# Patient Record
Sex: Female | Born: 1935 | Race: White | Hispanic: No | Smoking: Never smoker
Health system: Southern US, Community
[De-identification: ages and names within clinical notes are randomized; demographics above are authoritative.]

## PROBLEM LIST (undated history)

## (undated) DIAGNOSIS — R609 Edema, unspecified: Secondary | ICD-10-CM

## (undated) DIAGNOSIS — H409 Unspecified glaucoma: Secondary | ICD-10-CM

## (undated) DIAGNOSIS — K219 Gastro-esophageal reflux disease without esophagitis: Secondary | ICD-10-CM

## (undated) DIAGNOSIS — R000 Tachycardia, unspecified: Secondary | ICD-10-CM

## (undated) DIAGNOSIS — H3530 Unspecified macular degeneration: Secondary | ICD-10-CM

## (undated) DIAGNOSIS — M1990 Unspecified osteoarthritis, unspecified site: Secondary | ICD-10-CM

## (undated) HISTORY — DX: Edema, unspecified: R60.9

## (undated) HISTORY — PX: TONSILLECTOMY: SUR1361

## (undated) HISTORY — DX: Unspecified macular degeneration: H35.30

## (undated) HISTORY — DX: Tachycardia, unspecified: R00.0

## (undated) HISTORY — DX: Unspecified glaucoma: H40.9

## (undated) HISTORY — DX: Unspecified osteoarthritis, unspecified site: M19.90

## (undated) HISTORY — PX: CATARACT EXTRACTION, BILATERAL: SHX1313

## (undated) HISTORY — DX: Gastro-esophageal reflux disease without esophagitis: K21.9

---

## 2018-02-22 ENCOUNTER — Other Ambulatory Visit: Payer: Self-pay

## 2018-02-22 ENCOUNTER — Inpatient Hospital Stay (HOSPITAL_COMMUNITY)
Admission: EM | Admit: 2018-02-22 | Discharge: 2018-02-27 | DRG: 062 | Disposition: A | Discharge status: Skilled Nursing Facility | Payer: Medicare Other | Attending: Neurology | Admitting: Neurology

## 2018-02-22 ENCOUNTER — Encounter (HOSPITAL_COMMUNITY): Payer: Self-pay

## 2018-02-22 ENCOUNTER — Emergency Department (HOSPITAL_COMMUNITY): Payer: Medicare Other

## 2018-02-22 DIAGNOSIS — G8194 Hemiplegia, unspecified affecting left nondominant side: Secondary | ICD-10-CM | POA: Diagnosis present

## 2018-02-22 DIAGNOSIS — Z7982 Long term (current) use of aspirin: Secondary | ICD-10-CM

## 2018-02-22 DIAGNOSIS — Z888 Allergy status to other drugs, medicaments and biological substances status: Secondary | ICD-10-CM

## 2018-02-22 DIAGNOSIS — I081 Rheumatic disorders of both mitral and tricuspid valves: Secondary | ICD-10-CM | POA: Diagnosis present

## 2018-02-22 DIAGNOSIS — E785 Hyperlipidemia, unspecified: Secondary | ICD-10-CM | POA: Diagnosis present

## 2018-02-22 DIAGNOSIS — R1310 Dysphagia, unspecified: Secondary | ICD-10-CM | POA: Diagnosis present

## 2018-02-22 DIAGNOSIS — I482 Chronic atrial fibrillation: Secondary | ICD-10-CM | POA: Diagnosis present

## 2018-02-22 DIAGNOSIS — H9190 Unspecified hearing loss, unspecified ear: Secondary | ICD-10-CM | POA: Diagnosis present

## 2018-02-22 DIAGNOSIS — N179 Acute kidney failure, unspecified: Secondary | ICD-10-CM | POA: Diagnosis present

## 2018-02-22 DIAGNOSIS — I639 Cerebral infarction, unspecified: Secondary | ICD-10-CM

## 2018-02-22 DIAGNOSIS — I509 Heart failure, unspecified: Secondary | ICD-10-CM | POA: Diagnosis present

## 2018-02-22 DIAGNOSIS — R29810 Facial weakness: Secondary | ICD-10-CM | POA: Diagnosis present

## 2018-02-22 DIAGNOSIS — I63411 Cerebral infarction due to embolism of right middle cerebral artery: Secondary | ICD-10-CM | POA: Diagnosis present

## 2018-02-22 DIAGNOSIS — I110 Hypertensive heart disease with heart failure: Secondary | ICD-10-CM | POA: Diagnosis present

## 2018-02-22 DIAGNOSIS — I6340 Cerebral infarction due to embolism of unspecified cerebral artery: Secondary | ICD-10-CM | POA: Diagnosis present

## 2018-02-22 DIAGNOSIS — J449 Chronic obstructive pulmonary disease, unspecified: Secondary | ICD-10-CM | POA: Diagnosis present

## 2018-02-22 DIAGNOSIS — I4891 Unspecified atrial fibrillation: Secondary | ICD-10-CM | POA: Diagnosis present

## 2018-02-22 DIAGNOSIS — R29713 NIHSS score 13: Secondary | ICD-10-CM | POA: Diagnosis present

## 2018-02-22 DIAGNOSIS — R414 Neurologic neglect syndrome: Secondary | ICD-10-CM | POA: Diagnosis present

## 2018-02-22 DIAGNOSIS — R471 Dysarthria and anarthria: Secondary | ICD-10-CM | POA: Diagnosis present

## 2018-02-22 DIAGNOSIS — D509 Iron deficiency anemia, unspecified: Secondary | ICD-10-CM | POA: Diagnosis present

## 2018-02-22 DIAGNOSIS — G629 Polyneuropathy, unspecified: Secondary | ICD-10-CM | POA: Diagnosis present

## 2018-02-22 DIAGNOSIS — Z79899 Other long term (current) drug therapy: Secondary | ICD-10-CM

## 2018-02-22 DIAGNOSIS — I272 Pulmonary hypertension, unspecified: Secondary | ICD-10-CM | POA: Diagnosis present

## 2018-02-22 DIAGNOSIS — Z885 Allergy status to narcotic agent status: Secondary | ICD-10-CM | POA: Diagnosis not present

## 2018-02-22 DIAGNOSIS — E876 Hypokalemia: Secondary | ICD-10-CM

## 2018-02-22 DIAGNOSIS — Z66 Do not resuscitate: Secondary | ICD-10-CM | POA: Diagnosis present

## 2018-02-22 DIAGNOSIS — Z9181 History of falling: Secondary | ICD-10-CM

## 2018-02-22 DIAGNOSIS — R4781 Slurred speech: Secondary | ICD-10-CM | POA: Diagnosis present

## 2018-02-22 DIAGNOSIS — I480 Paroxysmal atrial fibrillation: Secondary | ICD-10-CM

## 2018-02-22 DIAGNOSIS — D500 Iron deficiency anemia secondary to blood loss (chronic): Secondary | ICD-10-CM | POA: Diagnosis not present

## 2018-02-22 DIAGNOSIS — I340 Nonrheumatic mitral (valve) insufficiency: Secondary | ICD-10-CM | POA: Diagnosis not present

## 2018-02-22 DIAGNOSIS — D508 Other iron deficiency anemias: Secondary | ICD-10-CM | POA: Diagnosis not present

## 2018-02-22 LAB — I-STAT CHEM 8, ED
BUN: 34 mg/dL — ABNORMAL HIGH (ref 6–20)
CALCIUM ION: 1.16 mmol/L (ref 1.15–1.40)
CREATININE: 1.4 mg/dL — AB (ref 0.44–1.00)
Chloride: 98 mmol/L — ABNORMAL LOW (ref 101–111)
GLUCOSE: 109 mg/dL — AB (ref 65–99)
HCT: 24 % — ABNORMAL LOW (ref 36.0–46.0)
HEMOGLOBIN: 8.2 g/dL — AB (ref 12.0–15.0)
Potassium: 4.8 mmol/L (ref 3.5–5.1)
Sodium: 132 mmol/L — ABNORMAL LOW (ref 135–145)
TCO2: 25 mmol/L (ref 22–32)

## 2018-02-22 LAB — CBC WITH DIFFERENTIAL/PLATELET
Basophils Absolute: 0 10*3/uL (ref 0.0–0.1)
Basophils Relative: 0 %
EOS ABS: 0 10*3/uL (ref 0.0–0.7)
EOS PCT: 0 %
HCT: 22.3 % — ABNORMAL LOW (ref 36.0–46.0)
Hemoglobin: 7 g/dL — ABNORMAL LOW (ref 12.0–15.0)
Lymphocytes Relative: 36 %
Lymphs Abs: 3.6 10*3/uL (ref 0.7–4.0)
MCH: 25.4 pg — AB (ref 26.0–34.0)
MCHC: 31.4 g/dL (ref 30.0–36.0)
MCV: 80.8 fL (ref 78.0–100.0)
MONO ABS: 0.4 10*3/uL (ref 0.1–1.0)
Monocytes Relative: 4 %
Neutro Abs: 5.9 10*3/uL (ref 1.7–7.7)
Neutrophils Relative %: 60 %
PLATELETS: 288 10*3/uL (ref 150–400)
RBC: 2.76 MIL/uL — AB (ref 3.87–5.11)
RDW: 16.5 % — AB (ref 11.5–15.5)
WBC: 10 10*3/uL (ref 4.0–10.5)

## 2018-02-22 LAB — COMPREHENSIVE METABOLIC PANEL
ALK PHOS: 76 U/L (ref 38–126)
ALT: 8 U/L — AB (ref 14–54)
AST: 19 U/L (ref 15–41)
Albumin: 3 g/dL — ABNORMAL LOW (ref 3.5–5.0)
Anion gap: 10 (ref 5–15)
BILIRUBIN TOTAL: 0.7 mg/dL (ref 0.3–1.2)
BUN: 36 mg/dL — AB (ref 6–20)
CALCIUM: 8.8 mg/dL — AB (ref 8.9–10.3)
CO2: 24 mmol/L (ref 22–32)
CREATININE: 1.38 mg/dL — AB (ref 0.44–1.00)
Chloride: 99 mmol/L — ABNORMAL LOW (ref 101–111)
GFR, EST AFRICAN AMERICAN: 35 mL/min — AB (ref >60)
GFR, EST NON AFRICAN AMERICAN: 30 mL/min — AB (ref >60)
Glucose, Bld: 110 mg/dL — ABNORMAL HIGH (ref 65–99)
Potassium: 4.9 mmol/L (ref 3.5–5.1)
Sodium: 133 mmol/L — ABNORMAL LOW (ref 135–145)
Total Protein: 5.7 g/dL — ABNORMAL LOW (ref 6.5–8.1)

## 2018-02-22 LAB — I-STAT TROPONIN, ED: TROPONIN I, POC: 0.03 ng/mL (ref 0.00–0.08)

## 2018-02-22 LAB — PROTIME-INR
INR: 1.13
Prothrombin Time: 14.4 seconds (ref 11.4–15.2)

## 2018-02-22 LAB — APTT: aPTT: 31 seconds (ref 24–36)

## 2018-02-22 MED ORDER — ONDANSETRON HCL 4 MG/2ML IJ SOLN
4.0000 mg | Freq: Once | INTRAMUSCULAR | Status: AC
  Administered 2018-02-22: 4 mg via INTRAVENOUS

## 2018-02-22 MED ORDER — STROKE: EARLY STAGES OF RECOVERY BOOK
Freq: Once | Status: DC
  Filled 2018-02-22: qty 1

## 2018-02-22 MED ORDER — METOPROLOL SUCCINATE ER 50 MG PO TB24
50.0000 mg | ORAL_TABLET | Freq: Every day | ORAL | Status: DC
  Administered 2018-02-23: 50 mg via ORAL
  Filled 2018-02-22: qty 1

## 2018-02-22 MED ORDER — SODIUM CHLORIDE 0.9 % IV SOLN
INTRAVENOUS | Status: DC
  Administered 2018-02-22: 19:00:00 via INTRAVENOUS

## 2018-02-22 MED ORDER — PANTOPRAZOLE SODIUM 40 MG IV SOLR
40.0000 mg | Freq: Every day | INTRAVENOUS | Status: DC
  Administered 2018-02-22: 40 mg via INTRAVENOUS
  Filled 2018-02-22: qty 40

## 2018-02-22 MED ORDER — ALBUTEROL SULFATE (2.5 MG/3ML) 0.083% IN NEBU
3.0000 mL | INHALATION_SOLUTION | RESPIRATORY_TRACT | Status: DC
  Administered 2018-02-23: 3 mL via RESPIRATORY_TRACT
  Filled 2018-02-22: qty 3

## 2018-02-22 MED ORDER — LABETALOL HCL 5 MG/ML IV SOLN: 10.0000 mg | INTRAVENOUS | Status: DC | PRN

## 2018-02-22 MED ORDER — FUROSEMIDE 20 MG PO TABS
20.0000 mg | ORAL_TABLET | Freq: Two times a day (BID) | ORAL | Status: DC
  Administered 2018-02-23: 20 mg via ORAL
  Filled 2018-02-22: qty 1

## 2018-02-22 MED ORDER — ALTEPLASE (STROKE) FULL DOSE INFUSION
0.9000 mg/kg | Freq: Once | INTRAVENOUS | Status: AC
  Administered 2018-02-22: 46.9 mg via INTRAVENOUS
  Filled 2018-02-22: qty 100

## 2018-02-22 NOTE — Consult Note (Addendum)
Chief Complaint:  Left side weakness  History obtained from: Patient and Chart  HPI:                                                                                                                                         Krista Cook is an 84 y.o. female a past medical history of atrial fibrillation not on anticoagulation, COPD,CHF peripheral neuropathy presents as a stroke alert.  Patient lives in assisted living facility and per caretaker last seen normal was 4:30 PM. She was then noted to have sudden onset dysarthria and left-sided weakness. EMS was called and [facility]. Blood pressure was 130 systolic on arrival. She was noted to be neglecting the left side, in addition to having left facial droop, dysarthria and some weakness. CT head showed no acute findings and aspects 10. NIHSS was 13. She received iv TPA after speaking with daughter.  After having prolonged discussion with daughter, it was decided that we would not pursue mechanical thrombectomy even if she had a large vessel occlusion has a daughter who was POA felt this would too invasive.     Date last known well: Date: 02/22/2018 Time last known well: Time: 16:30 tPA Given: Yes Modified Rankin: Rankin Score=1     Allergies:  Allergies  Allergen Reactions  . Celebrex [Celecoxib]   . Ultram [Tramadol]     Medications:                                                                                                                           Reviewed from care everywhere  ROS:  History obtained from patient   General ROS: negative for - chills, fatigue, fever, night sweats, weight gain or weight loss Psychological ROS: negative for - , hallucinations, memory difficulties, mood swings or  Ophthalmic ROS: negative for - blurry vision, double vision, eye  pain or loss of vision ENT ROS: negative for - epistaxis, nasal discharge, oral lesions, sore throat, tinnitus or vertigo Respiratory ROS: negative for - cough,  shortness of breath or wheezing Cardiovascular ROS: negative for - chest pain, dyspnea on exertion,  Gastrointestinal ROS: negative for - abdominal pain, diarrhea,  nausea/vomiting or stool incontinence Genito-Urinary ROS: negative for - dysuria, hematuria, incontinence or urinary frequency/urgency Musculoskeletal ROS: negative for - joint swelling or muscular weakness Neurological ROS: as noted in HPI   General Examination:                                                                                                      Blood pressure 118/64, pulse 76, resp. rate 20, height 5\' 2"  (1.575 m), weight 51.7 kg (114 lb), SpO2 100 %.  HEENT-  Normocephalic, no lesions, without obvious abnormality.  Normal external eye and conjunctiva.   Cardiovascular- S1-S2 audible, pulses palpable throughout   Lungs-no rhonchi or wheezing noted, no excessive working breathing.  Saturations within normal limits Abdomen- All 4 quadrants palpated and nontender Extremities- Warm, dry and intact Musculoskeletal-no joint tenderness, deformity or swelling Skin-warm and dry, no hyperpigmentation, vitiligo, or suspicious lesions  Neurological Examination Mental Status: Alert, oriented, thought content appropriate.  Speech fluent without evidence of aphasia.  Moderately dysarthric. Able to follow 3 step commands without difficulty. Cranial Nerves: II: Discs flat bilaterally; Visual fields: left hemineglect III,IV, VI: right gaze preference, crosses midline V,VII: left facial droop, crease sensation of left-sided face VIII: hearing normal bilaterally IX,X: uvula rises symmetrically XI: bilateral shoulder shrug XII: midline tongue extension Motor: Right : Upper extremity   5/5    Left:     Upper extremity   3/5  Lower extremity   5/5     Lower extremity    3/5 Tone and bulk:normal tone throughout; no atrophy noted Sensory: reduced sensation of left arm and leg Deep Tendon Reflexes: 1+ and symmetric throughout Plantars: Right: downgoing   Left: downgoing Cerebellar: normal finger-to-nose on right side Gait: not assess gait due to safety   Lab Results: Basic Metabolic Panel: Recent Labs  Lab 02/22/18 1739 02/22/18 1746  NA 133* 132*  K 4.9 4.8  CL 99* 98*  CO2 24  --   GLUCOSE 110* 109*  BUN 36* 34*  CREATININE 1.38* 1.40*  CALCIUM 8.8*  --     CBC: Recent Labs  Lab 02/22/18 1746 02/22/18 2034  WBC  --  10.0  NEUTROABS  --  5.9  HGB 8.2* 7.0*  HCT 24.0* 22.3*  MCV  --  80.8  PLT  --  288    Lipid Panel: No results for input(s): CHOL, TRIG, HDL, CHOLHDL, VLDL, LDLCALC in the last 168 hours.  CBG: No results for input(s): GLUCAP in the last 168 hours.  Imaging: Ct  Head Code Stroke Wo Contrast  Result Date: 02/22/2018 CLINICAL DATA:  Code stroke. Initial evaluation for acute slurred speech, left-sided deficits. EXAM: CT HEAD WITHOUT CONTRAST TECHNIQUE: Contiguous axial images were obtained from the base of the skull through the vertex without intravenous contrast. COMPARISON:  None. FINDINGS: Brain: Generalized age-related cerebral atrophy. Confluent hypodensity within the periventricular white matter most consistent with chronic small vessel ischemic disease, mild to moderate nature. No acute intracranial hemorrhage. No acute large vessel territory infarct. No mass lesion, midline shift or mass effect. Mild diffuse ventricular prominence related global parenchymal volume loss of hydrocephalus. No extra-axial fluid collection. Vascular: No asymmetric hyperdense vessel. Scattered vascular calcifications noted within the carotid siphons. Skull: Scalp soft tissues and calvarium demonstrate no acute abnormality. Sinuses/Orbits: Globes and orbital soft tissues within normal limits. Paranasal sinuses and mastoid air cells are  clear. Other: None. ASPECTS [facility] Stroke Program Early CT Score) - Ganglionic level infarction (caudate, lentiform nuclei, internal capsule, insula, M1-M3 cortex): 7 - Supraganglionic infarction (M4-M6 cortex): 3 Total score (0-10 with 10 being normal): 10 IMPRESSION: 1. No acute intracranial infarct or other process identified. 2. ASPECTS is 10. 3. Generalized age-related cerebral atrophy with mild to moderate chronic small vessel ischemic disease. These results were communicated to Dr. Laurence Slate at 6:04 pmon 3/25/2019by text page via the [facility] messaging system. Electronically Signed   By: Rise Mu M.D.   On: 02/22/2018 18:09   ASSESSMENT AND PLAN  84 y.o. female a past medical history of atrial fibrillation not on anticoagulation, COPD,CHF peripheral neuropathy presents as a stroke alert for left neglect, dysarthria, left facial droop and left sided weakness. Likely right MCA stroke due to embolus from chronic atrial fibrillation not being on anticoagulation. Previously on warfarin however this was discontinued due to concern of falls.Currently on aspirin 81 mg  She received IV TPA. Patient's symptoms are improving and after having discussion with daughter - given patient's wishes and history of COPD, moderately dependent lifestyle risk of intevention for possible LVO would outweigh risk of intervention.    Acute Ischemic Stroke   Risk factors: Afib, COPD, CHF Etiology: cardioembolic  Recommend # MRI of the brain without contrast #MRA Head and neck  #Transthoracic Echo  # no antiplatelet as patient received tPA #Start or continue Atorvastatin 80 mg/other high intensity statin # BP goal: permissive HTN upto 185/100 mmHg # HBAIC and Lipid profile # Telemetry monitoring # Frequent neuro checks # NPO until passes stroke swallow screen  Chronic Atrial Fibrillation Not on AC Continue rate control medication metoprolol   COPD Continue home duo nebs  CHF Gentle hydration to  avoid volume overload Hold ASA, continue BB  Peripheral neuropathy Continue gabapentin   DVT PPX; SCD Diet: NPO until bedside swallow eval  Please page stroke NP  Or  PA  Or MD from 8am -4 pm  as this patient from this time will be  followed by the stroke.   You can look them up on [email or web address]  Password [email or web address]    This patient is neurologically critically ill due to stroke and received IV tpA. She is at risk for significant risk of neurological worsening from cerebral edema,  death from brain herniation, heart failure, hemorrhagic conversion, infection, respiratory failure and seizure. This patient's care requires constant monitoring of vital signs, hemodynamics, respiratory and cardiac monitoring, review of multiple databases, neurological assessment, discussion with family, other specialists and medical decision making of high complexity.  I spent 60 minutes of neurocritical time in the care  of this patient.

## 2018-02-22 NOTE — ED Provider Notes (Signed)
[facility] EMERGENCY DEPARTMENT Provider Note   CSN: [id number] Arrival date & time: 02/22/18  1734   An emergency department physician performed an initial assessment on this suspected stroke patient at 1730.  History   Chief Complaint Chief Complaint  Patient presents with  . Code Stroke    HPI Shanena Pellegrino is a 84 y.o. female.  HPI Patient lives independently at an assisted living.  Per the patient's daughter, at 4: 30 staff came to bring the patient her dinner menu and advised that her speech was slurred and she had left-sided weakness.  Patient's daughter reports they called her and she advised for transport to the emergency department.  I have seen the patient after initiation of TPA.  Patient's daughter reports that she was told she had significant left sided weakness.  At the current examination patient is able to elevate her left extremities off of the bed and also perform grip and flexion extension which per her daughter, is a significant improvement.  The patient has no complaints.  She denies any headache or chest pain.  She denies awareness that she was having the symptoms as reported.  The patient's daughter reports that yesterday she was well.  She has been active at baseline but has had a couple of more recent falls.  She reports that she did strain her lower back and that has been bothering her more recently. History reviewed. No pertinent past medical history.  Patient Active Problem List   Diagnosis Date Noted  . Stroke (cerebrum) (HCC) 02/22/2018    History reviewed. No pertinent surgical history.   OB History   None      Home Medications    Prior to Admission medications   Not on File    Family History History reviewed. No pertinent family history.  Social History Social History   Tobacco Use  . Smoking status: Never Smoker  . Smokeless tobacco: Never Used  Substance Use Topics  . Alcohol use: Not on file  . Drug use:  Not on file     Allergies   Celebrex [celecoxib] and Ultram [tramadol]   Review of Systems Review of Systems 10 Systems reviewed and are negative for acute change except as noted in the HPI.   Physical Exam Updated Vital Signs BP (!) 152/78   Pulse 92   Resp (!) 23   Ht 5\' 2"  (1.575 m)   Wt 51.7 kg (114 lb) Comment: Simultaneous filing. User may not have seen previous data.  SpO2 99% Comment: 2L o2  BMI 20.85 kg/m   Physical Exam  Constitutional:  Patient is alert.  She has no respiratory distress.  She does seem to have a evident left mouth droop.  HENT:  Head: Normocephalic and atraumatic.  Nose: Nose normal.  Mouth/Throat: Oropharynx is clear and moist.  Eyes: Pupils are equal, round, and reactive to light. EOM are normal.  Neck: Neck supple.  Cardiovascular:  Irregularly irregular.  No gross rub murmur gallop.  Pulmonary/Chest: Effort normal and breath sounds normal.  Abdominal: Soft. She exhibits no distension. There is no tenderness. There is no guarding.  Musculoskeletal:  Patient has a number of senile ecchymoses on her lower extremities.  Trace edema bilaterally.  No deformities of the extremities.  Neurological:  The patient is alert.  She follows commands appropriately.  He has pronounced left facial droop.  Speech is slightly slurred but content is normal.  Due to facial droop, patient's respirations make a soft sonorous sound.  Airway is protected however no pooling secretions or difficulty breathing.  Patient has grip strength bilateral upper extremities.  Left is at 3\5 relative to right.  Patient can elevate each lower extremity off of the bed and hold.  She has intact Lanter flexion strength.  Skin: Skin is warm and dry.  Psychiatric: She has a normal mood and affect.     ED Treatments / Results  Labs (all labs ordered are listed, but only abnormal results are displayed) Labs Reviewed  I-STAT CHEM 8, ED - Abnormal; Notable for the following  components:      Result Value   Sodium 132 (*)    Chloride 98 (*)    BUN 34 (*)    Creatinine, Ser 1.40 (*)    Glucose, Bld 109 (*)    Hemoglobin 8.2 (*)    HCT 24.0 (*)    All other components within normal limits  PROTIME-INR  APTT  COMPREHENSIVE METABOLIC PANEL  CBC WITH DIFFERENTIAL/PLATELET  HEMOGLOBIN A1C  LIPID PANEL  I-STAT TROPONIN, ED  CBG MONITORING, ED    EKG None  Radiology Ct Head Code Stroke Wo Contrast  Result Date: 02/22/2018 CLINICAL DATA:  Code stroke. Initial evaluation for acute slurred speech, left-sided deficits. EXAM: CT HEAD WITHOUT CONTRAST TECHNIQUE: Contiguous axial images were obtained from the base of the skull through the vertex without intravenous contrast. COMPARISON:  None. FINDINGS: Brain: Generalized age-related cerebral atrophy. Confluent hypodensity within the periventricular white matter most consistent with chronic small vessel ischemic disease, mild to moderate nature. No acute intracranial hemorrhage. No acute large vessel territory infarct. No mass lesion, midline shift or mass effect. Mild diffuse ventricular prominence related global parenchymal volume loss of hydrocephalus. No extra-axial fluid collection. Vascular: No asymmetric hyperdense vessel. Scattered vascular calcifications noted within the carotid siphons. Skull: Scalp soft tissues and calvarium demonstrate no acute abnormality. Sinuses/Orbits: Globes and orbital soft tissues within normal limits. Paranasal sinuses and mastoid air cells are clear. Other: None. ASPECTS [facility] Stroke Program Early CT Score) - Ganglionic level infarction (caudate, lentiform nuclei, internal capsule, insula, M1-M3 cortex): 7 - Supraganglionic infarction (M4-M6 cortex): 3 Total score (0-10 with 10 being normal): 10 IMPRESSION: 1. No acute intracranial infarct or other process identified. 2. ASPECTS is 10. 3. Generalized age-related cerebral atrophy with mild to moderate chronic small vessel ischemic  disease. These results were communicated to Dr. Laurence Slate at 6:04 pmon 3/25/2019by text page via the [facility] messaging system. Electronically Signed   By: Rise Mu M.D.   On: 02/22/2018 18:09    Procedures Procedures (including critical care time)  Medications Ordered in ED Medications  alteplase (ACTIVASE) 1 mg/mL infusion 46.9 mg (46.9 mg Intravenous New Bag/Given 02/22/18 1758)   stroke: mapping our early stages of recovery book (has no administration in time range)  0.9 %  sodium chloride infusion (has no administration in time range)  pantoprazole (PROTONIX) injection 40 mg (has no administration in time range)  labetalol (NORMODYNE,TRANDATE) injection 10 mg (has no administration in time range)     Initial Impression / Assessment and Plan / ED Course  I have reviewed the triage vital signs and the nursing notes.  Pertinent labs & imaging results that were available during my care of the patient were reviewed by me and considered in my medical decision making (see chart for details).    Patient has already been seen by neurology as a code stroke with initiation of management with TPA.  Final Clinical Impressions(s) / ED Diagnoses  Final diagnoses:  Acute ischemic stroke [facility](HCC)  Paroxysmal atrial fibrillation [facility](HCC)  Patient arrived as a code stroke.  Management with TPA initiated by neurology upon initial arrival CT assessment.  It appears the patient has shown some improvement by the description given from her daughter.  At this time, the patient is awake and alert.  She does have persistent facial droop and slightly slurred speech but can independently use all 4 extremities with slight weakness of the left upper extremity relative to the right.  We will continue to observe the patient while she is in the emergency department with planned admission continued management by neurology.  ED Discharge Orders    None       Arby BarrettePfeiffer, Kairos Panetta, MD 02/22/18 [phone]

## 2018-02-22 NOTE — ED Triage Notes (Signed)
Pt arrives to ED from independent living facility with complaints of slurred speech and left sided deficits  since 1630 this evening when pt was provided with a dinner menu by staff. Code stroke called, pt brought to Ct scanner, TPA initiated, and then ED room C22, family present (does not want pt to go to IR for any procedures). Pt placed in position of comfort with bed locked and lowered, call bell in reach.

## 2018-02-22 NOTE — ED Notes (Signed)
Pt had one episode of small vomitus, zofran given

## 2018-02-22 NOTE — Progress Notes (Signed)
Krista Cook DoingFrances Cabell is a [age] yo female coming from independent living [facility] with acute onset of slurred speech and Left side deficits. LKW 1630 by staff. NIH 13, CBG 109. TPA started at [id number]

## 2018-02-23 ENCOUNTER — Inpatient Hospital Stay (HOSPITAL_COMMUNITY): Payer: Medicare Other

## 2018-02-23 DIAGNOSIS — D500 Iron deficiency anemia secondary to blood loss (chronic): Secondary | ICD-10-CM

## 2018-02-23 DIAGNOSIS — I639 Cerebral infarction, unspecified: Secondary | ICD-10-CM

## 2018-02-23 DIAGNOSIS — I482 Chronic atrial fibrillation: Secondary | ICD-10-CM

## 2018-02-23 DIAGNOSIS — E785 Hyperlipidemia, unspecified: Secondary | ICD-10-CM

## 2018-02-23 DIAGNOSIS — I63411 Cerebral infarction due to embolism of right middle cerebral artery: Secondary | ICD-10-CM

## 2018-02-23 LAB — CBC
HCT: 23.6 % — ABNORMAL LOW (ref 36.0–46.0)
Hemoglobin: 7.5 g/dL — ABNORMAL LOW (ref 12.0–15.0)
MCH: 26.5 pg (ref 26.0–34.0)
MCHC: 31.8 g/dL (ref 30.0–36.0)
MCV: 83.4 fL (ref 78.0–100.0)
PLATELETS: 291 10*3/uL (ref 150–400)
RBC: 2.83 MIL/uL — ABNORMAL LOW (ref 3.87–5.11)
RDW: 16.8 % — ABNORMAL HIGH (ref 11.5–15.5)
WBC: 9.3 10*3/uL (ref 4.0–10.5)

## 2018-02-23 LAB — LACTATE DEHYDROGENASE: LDH: 152 U/L (ref 98–192)

## 2018-02-23 LAB — IRON AND TIBC
IRON: 12 ug/dL — AB (ref 28–170)
SATURATION RATIOS: 3 % — AB (ref 10.4–31.8)
TIBC: 421 ug/dL (ref 250–450)
UIBC: 409 ug/dL

## 2018-02-23 LAB — VITAMIN B12: VITAMIN B 12: 541 pg/mL (ref 180–914)

## 2018-02-23 LAB — LIPID PANEL
CHOL/HDL RATIO: 2.6 ratio
CHOLESTEROL: 138 mg/dL (ref 0–200)
HDL: 53 mg/dL (ref >40)
LDL Cholesterol: 78 mg/dL (ref 0–99)
Triglycerides: 33 mg/dL (ref <150)
VLDL: 7 mg/dL (ref 0–40)

## 2018-02-23 LAB — BASIC METABOLIC PANEL
Anion gap: 8 (ref 5–15)
BUN: 29 mg/dL — ABNORMAL HIGH (ref 6–20)
CALCIUM: 8.6 mg/dL — AB (ref 8.9–10.3)
CO2: 23 mmol/L (ref 22–32)
Chloride: 102 mmol/L (ref 101–111)
Creatinine, Ser: 1.15 mg/dL — ABNORMAL HIGH (ref 0.44–1.00)
GFR calc Af Amer: 44 mL/min — ABNORMAL LOW (ref >60)
GFR, EST NON AFRICAN AMERICAN: 38 mL/min — AB (ref >60)
GLUCOSE: 136 mg/dL — AB (ref 65–99)
POTASSIUM: 4.5 mmol/L (ref 3.5–5.1)
Sodium: 133 mmol/L — ABNORMAL LOW (ref 135–145)

## 2018-02-23 LAB — FERRITIN: Ferritin: 10 ng/mL — ABNORMAL LOW (ref 11–307)

## 2018-02-23 LAB — HEMOGLOBIN A1C
Hgb A1c MFr Bld: 5.2 % (ref 4.8–5.6)
Mean Plasma Glucose: 102.54 mg/dL

## 2018-02-23 LAB — MRSA PCR SCREENING: MRSA by PCR: NEGATIVE

## 2018-02-23 LAB — FOLATE: Folate: 14.5 ng/mL (ref >5.9)

## 2018-02-23 MED ORDER — FERROUS SULFATE 325 (65 FE) MG PO TABS
325.0000 mg | ORAL_TABLET | Freq: Three times a day (TID) | ORAL | Status: DC
  Administered 2018-02-23 – 2018-02-27 (×11): 325 mg via ORAL
  Filled 2018-02-23 (×11): qty 1

## 2018-02-23 MED ORDER — PANTOPRAZOLE SODIUM 40 MG PO TBEC
40.0000 mg | DELAYED_RELEASE_TABLET | Freq: Every day | ORAL | Status: DC
  Administered 2018-02-23 – 2018-02-25 (×3): 40 mg via ORAL
  Filled 2018-02-23 (×3): qty 1

## 2018-02-23 MED ORDER — ATORVASTATIN CALCIUM 10 MG PO TABS
10.0000 mg | ORAL_TABLET | Freq: Every day | ORAL | Status: DC
  Administered 2018-02-23 – 2018-02-26 (×4): 10 mg via ORAL
  Filled 2018-02-23 (×4): qty 1

## 2018-02-23 MED ORDER — ALBUTEROL SULFATE (2.5 MG/3ML) 0.083% IN NEBU: 3.0000 mL | INHALATION_SOLUTION | RESPIRATORY_TRACT | Status: DC | PRN

## 2018-02-23 MED ORDER — MOMETASONE FURO-FORMOTEROL FUM 100-5 MCG/ACT IN AERO
1.0000 | INHALATION_SPRAY | Freq: Two times a day (BID) | RESPIRATORY_TRACT | Status: DC
  Administered 2018-02-23 – 2018-02-27 (×5): 1 via RESPIRATORY_TRACT
  Filled 2018-02-23 (×2): qty 8.8

## 2018-02-23 MED ORDER — METOPROLOL SUCCINATE ER 25 MG PO TB24
25.0000 mg | ORAL_TABLET | Freq: Every day | ORAL | Status: DC
  Administered 2018-02-24 – 2018-02-27 (×3): 25 mg via ORAL
  Filled 2018-02-23 (×3): qty 1

## 2018-02-23 MED ORDER — ASPIRIN EC 81 MG PO TBEC
81.0000 mg | DELAYED_RELEASE_TABLET | Freq: Every day | ORAL | Status: DC
  Administered 2018-02-23 – 2018-02-24 (×2): 81 mg via ORAL
  Filled 2018-02-23 (×2): qty 1

## 2018-02-23 NOTE — Progress Notes (Signed)
OT Cancellation Note  Patient Details Name: Krista Cook MRN: [id number] DOB: 02/02/1915   Cancelled Treatment:    Reason Eval/Treat Not Completed: Active bedrest order  [facility] Center[facility],HILLARY  Agripina Guyette, OT/L  [phone] 02/23/2018 02/23/2018, 6:59 AM

## 2018-02-23 NOTE — Progress Notes (Signed)
*  PRELIMINARY RESULTS* Vascular Ultrasound Carotid Duplex (Doppler) has been completed.  Findings suggest 1-39% internal carotid artery stenosis bilaterally. Vertebral arteries are patent with antegrade flow.  02/23/2018 2:36 PM Gertie FeyMichelle Anette Barra, BS, RVT, RDCS, RDMS

## 2018-02-23 NOTE — Evaluation (Signed)
Clinical/Bedside Swallow Evaluation Patient Details  Name: Krista Cook Standing MRN: [id number] Date of Birth: 11/11/1915  Today's Date: 02/23/2018 Time: SLP Start Time (ACUTE ONLY): 0907 SLP Stop Time (ACUTE ONLY): 0920 SLP Time Calculation (min) (ACUTE ONLY): 13 min  Past Medical History: History reviewed. No pertinent past medical history. Past Surgical History: History reviewed. No pertinent surgical history. HPI:  Krista MadeiraFrances Stamatasis an [age] y.o.femalea past medical history of atrial fibrillation not on anticoagulation, COPD,CHF peripheral neuropathy presents as a stroke alert.Patient lives in assisted living facility and per caretaker last seen normal was 4:30 PM. She was then noted to have sudden onset dysarthria and left-sided weakness. . CT head showed no acute findings and aspects 10. NIHSS was 13. She received iv TPA after speaking with daughter.    Assessment / Plan / Recommendation Clinical Impression  Pt demonstrates no signs of acute impairment of swallowing or oral neuromuscular weakness following CVA. There are signs of mild age related changes to swallow mechanism including suspected minimal delay in swallow intiation given slightly audible swallow. A few instances of soft throat clearing with liquids and puree also noted, though no coughing or wet vocal quality observed. Pt reports no history of dysphagia and no prior diet modifications. Recommend pt resume a regular diet and thin liquids though SLP will f/u x1 to monitor tolerance of diet and address cognitive linguistic testing.  SLP Visit Diagnosis: Dysphagia, unspecified (R13.10)    Aspiration Risk  Mild aspiration risk    Diet Recommendation Regular;Thin liquid   Liquid Administration via: Cup;Straw Medication Administration: Whole meds with puree Supervision: Patient able to self feed Compensations: Slow rate;Small sips/bites Postural Changes: Seated upright at 90 degrees    Other  Recommendations Oral Care  Recommendations: Oral care BID   Follow up Recommendations 24 hour supervision/assistance      Frequency and Duration min 1 x/week  1 week       Prognosis Prognosis for Safe Diet Advancement: Good      Swallow Study   General HPI: Krista MadeiraFrances Stamatasis an [age] y.o.femalea past medical history of atrial fibrillation not on anticoagulation, COPD,CHF peripheral neuropathy presents as a stroke alert.Patient lives in assisted living facility and per caretaker last seen normal was 4:30 PM. She was then noted to have sudden onset dysarthria and left-sided weakness. . CT head showed no acute findings and aspects 10. NIHSS was 13. She received iv TPA after speaking with daughter.  Type of Study: Bedside Swallow Evaluation Previous Swallow Assessment: none Diet Prior to this Study: NPO Temperature Spikes Noted: No Respiratory Status: Room air History of Recent Intubation: No Behavior/Cognition: Alert;Cooperative;Pleasant mood Oral Cavity Assessment: Within Functional Limits Oral Care Completed by SLP: No Oral Cavity - Dentition: Adequate natural dentition Vision: Functional for self-feeding Self-Feeding Abilities: Able to feed self Patient Positioning: Upright in bed Baseline Vocal Quality: Normal Volitional Cough: Strong Volitional Swallow: Able to elicit    Oral/Motor/Sensory Function Overall Oral Motor/Sensory Function: Within functional limits   Ice Chips     Thin Liquid Thin Liquid: Impaired Presentation: Cup;Straw;Self Fed Pharyngeal  Phase Impairments: Throat Clearing - Immediate    Nectar Thick Nectar Thick Liquid: Not tested   Honey Thick Honey Thick Liquid: Not tested   Puree Puree: Impaired Presentation: Self Fed;Spoon Pharyngeal Phase Impairments: Throat Clearing - Immediate   Solid   GO   Solid: Within functional limits       Harlon DittyBonnie Mohit Zirbes, MA CCC-SLP [phone]  Claudine MoutonDeBlois, Mykle [place] 02/23/2018,9:24 AM

## 2018-02-23 NOTE — Progress Notes (Addendum)
STROKE TEAM PROGRESS NOTE   SUBJECTIVE (INTERVAL HISTORY) No family is at the bedside.  Patient lying in bed. No complaints. Feels her speech is improved to baseline. HGB low, no obvious bleeding. Daughter lives in [place].   OBJECTIVE Vitals:   02/23/18 0500 02/23/18 0600 02/23/18 0700 02/23/18 0800  BP: (!) 116/57 117/62 120/61 120/74  Pulse: 73 75 79 80  Resp: 14 18 17 15   Temp:      TempSrc:      SpO2: 94% 96% 96% 97%  Weight:      Height:        CBC:  Recent Labs  Lab 02/22/18 1746 02/22/18 2034  WBC  --  10.0  NEUTROABS  --  5.9  HGB 8.2* 7.0*  HCT 24.0* 22.3*  MCV  --  80.8  PLT  --  288    Basic Metabolic Panel:  Recent Labs  Lab 02/22/18 1739 02/22/18 1746  NA 133* 132*  K 4.9 4.8  CL 99* 98*  CO2 24  --   GLUCOSE 110* 109*  BUN 36* 34*  CREATININE 1.38* 1.40*  CALCIUM 8.8*  --     Lipid Panel:     Component Value Date/Time   CHOL 138 02/23/2018 0334   TRIG 33 02/23/2018 0334   HDL 53 02/23/2018 0334   CHOLHDL 2.6 02/23/2018 0334   VLDL 7 02/23/2018 0334   LDLCALC 78 02/23/2018 0334   HgbA1c:  Lab Results  Component Value Date   HGBA1C 5.2 02/23/2018   Urine Drug Screen: No results found for: LABOPIA, COCAINSCRNUR, LABBENZ, AMPHETMU, THCU, LABBARB  Alcohol Level No results found for: ETH  IMAGING  Ct Head Code Stroke Wo Contrast  Result Date: 02/22/2018 CLINICAL DATA:  Code stroke. Initial evaluation for acute slurred speech, left-sided deficits. EXAM: CT HEAD WITHOUT CONTRAST TECHNIQUE: Contiguous axial images were obtained from the base of the skull through the vertex without intravenous contrast. COMPARISON:  None. FINDINGS: Brain: Generalized age-related cerebral atrophy. Confluent hypodensity within the periventricular white matter most consistent with chronic small vessel ischemic disease, mild to moderate nature. No acute intracranial hemorrhage. No acute large vessel territory infarct. No mass lesion, midline shift or mass  effect. Mild diffuse ventricular prominence related global parenchymal volume loss of hydrocephalus. No extra-axial fluid collection. Vascular: No asymmetric hyperdense vessel. Scattered vascular calcifications noted within the carotid siphons. Skull: Scalp soft tissues and calvarium demonstrate no acute abnormality. Sinuses/Orbits: Globes and orbital soft tissues within normal limits. Paranasal sinuses and mastoid air cells are clear. Other: None. ASPECTS [facility] Stroke Program Early CT Score) - Ganglionic level infarction (caudate, lentiform nuclei, internal capsule, insula, M1-M3 cortex): 7 - Supraganglionic infarction (M4-M6 cortex): 3 Total score (0-10 with 10 being normal): 10 IMPRESSION: 1. No acute intracranial infarct or other process identified. 2. ASPECTS is 10. 3. Generalized age-related cerebral atrophy with mild to moderate chronic small vessel ischemic disease. These results were communicated to Dr. Laurence Cook at 6:04 pmon 3/25/2019by text page via the [facility] messaging system. Electronically Signed   By: Rise Mu M.D.   On: 02/22/2018 18:09    PHYSICAL EXAM General - elderly thin but well developed, in no apparent distress. BUE dark bruising on on arms. Ophthalmologic - did not assess Cardiovascular - Irregular rate and rhythm with no murmur, no tachycardia. Lung - clear bilaterally. Mental Status - Alert and oriented to person, place and year, situation. Follows commands. Speech fluent, able to name and repeat.   Cranial Nerves II - XII -  II - no field cut, PERRL III, IV, VI - extraocular movements intact. V - Facial sensation intact bilaterally. VII - slight left facial droop. mild dysarthria. VIII - Mildly HOH (hearing aids) XI - SCMs intact XII - tongue midline  Motor Strength -  No UE drift. BUE grip strong. LUE 4/5, RUE 5/5. R arm orbits L. LLE 4/5, RLE 5/5. Muscle bulk decreased Reflexes - Did not assess Sensory - intact, symmetric bilaterally Coordination - no  ataxia  Gait and Station - not tested.  No noted bleeding   ASSESSMENT/PLAN [name] is a [age] y.o. female with history of AF not on AC, CHF, COPD and PN presenting with L dysphagia, L HP, L facial, L neglect. She received IV t-PA 02/22/2018 at 1758. She was not considered for endovascular intervention given age and risk.  Stroke:  Likely R brain infarct embolic secondary to known atrial fibrillation not on AC, s/p IV tPA   Resultant  Mild L hp and L facial drooop   Code Stroke CT head no acute abnormality, ASPECTS 10, Small vessel disease. Atrophy.   MRI head pending   MRA head pending   Carotid Doppler  pending   2D Echo  pending   LDL 78  HgbA1c 5.2  SCDs for VTE prophylaxis  Diet NPO time specified  Check puncture sites for bleeding or hematomas.  Bleeding precautions  Fall precautions - passed SLP swallow assessment for regular diet.  aspirin 81 mg daily prior to admission, now on No antithrombotic as post tPA. Given low hgb. Will hold antithrombotics post tPA.  Ongoing aggressive stroke risk factor management  Therapy recommendations:  pending   Disposition:  pending ([facility] [place])  Atrial Fibrillation  Home anticoagulation:  No antithrombotic. Had been on warfarin in the past but was d/c'd d/t fall risk 5 years ago. Patient reports she did not fall.   On metoprolol 50 daily  CHA2DS2-VASc Score = 6, (?2 oral anticoagulation recommended)  Age in Years:  ?[age]   +2    Sex:  Female   +1    Hypertension History:  0     Diabetes Mellitus:  0  Congestive Heart Failure History:  +1  Vascular Disease History:  0     Stroke/TIA/Thromboembolism History:  yes   +2  Hyperlipidemia  Home meds:  No statin  LDL 78, goal < 70  Add low dose statin given age  Continue statin at discharge  Anemia  Hgb 8.2 on admission ->7.0 last night. Hgb 12 in Sept 2019  B arms dark ecchymosis, no active bleeding noted  Check now: CBC, BMET, Fer,  Fr & TIBC, folate, haptoglobin and LDH  Hold post tPA antithrombotics   Repeat labs in am  Other Stroke Risk Factors  Advanced age  UDS / ETOH level not performed   Hx CHF. D/c IVF d/t CHF as taking diet  Other Active Problems  PN on  neurontin  COPD  Renal insufficiency, 34/1.4  Hospital day # 1  Krista MoodySharon Cook  [facility] See Amion for Pager information 02/23/2018 8:47 AM   ATTENDING NOTE: I reviewed above note and agree with the assessment and Cook. I have made any additions or clarifications directly to the above note. Pt was seen and examined.   84 year old female with history of atrial fibrillation off Coumadin for 5 years due to fall risk, COPD, CHF admitted for acute onset slurred speech, left-sided weakness, left facial droop and left neglect.  CT negative for bleeding.  Status post TPA.  Currently pending MRI/MRA/carotid Doppler/TTE.  A1c 5.2 LDL 78.  On exam, she is AAOx3, following all commands, no aphasia but mild dysarthria, no neglect, left facial droop, tongue midline. Hard of hearing. Left UE 4/5 on bicep and tricep but no drift, finger grip strong. RUE and BLE normal strength. Sensation symmetrical, no ataxia. Gait not tested.   Patient stroke most likely due to A. fib not on anticoagulation.  Patient cannot tell what the reason being taken off Coumadin 5 years ago.  However, she denies any bleeding or frequent falls, but saying she could not get good sleep lately in ALF. It is reasonable to consider Eliquis 2.5 twice daily if MRI no large stroke.  However, patient currently anemic with hemoglobin 7.0 with low MCH likely microcytic anemia with iron deficiency, will repeat H&H and check iron labs. Currently she has bruise on arms but no active bleeding. If Hb still around or lower than 7.0, we may need to consider transfusion. Hold off antithrombotic meds for now. May consider GI consult if needed. She passed swallow, put on diet and resume other home  meds.  Krista Plan, MD PhD Stroke Neurology 02/23/2018 10:06 AM  This patient is critically ill due to stroke s/p tPA, afib, severe anemia and at significant risk of neurological worsening, death form bleeding, hemorrhagic transformation, recurrent stroke, heart failure. This patient's care requires constant monitoring of vital signs, hemodynamics, respiratory and cardiac monitoring, review of multiple databases, neurological assessment, discussion with family, other specialists and medical decision making of high complexity. I spent 35 minutes of neurocritical care time in the care of this patient.   To contact Stroke Continuity provider, please refer to [email or web address]. After hours, contact General Neurology

## 2018-02-23 NOTE — Evaluation (Signed)
Occupational Therapy Evaluation Patient Details Name: Krista Cook MRN: [id number] DOB: 10/18/1915 Today's Date: 02/23/2018    History of Present Illness [age] yo admitted with left weakness and slurred speech s/p tPA, CT of head showed Evolving nonhemorrhagic right basal ganglia infarct.  PMhx: atrial fibrillation not on anticoagulation, COPD,CHF, peripheral neuropathy    Clinical Impression   Pt admitted with above. She demonstrates the below listed deficits and will benefit from continued OT to maximize safety and independence with BADLs.  Pt was fatigued during OT eval.  She demonstrates Lt UE weakness, questionable mild Lt inattention, impaired sequencing, impaired balance.  She requires min A for UB ADLs and mod A for LB ADLs and mod A for functional transfers.   She lives at ALF, and has assist for some ADLs.  She likely will be okay to return to ALF, but may need increased assist initially.  Will follow acutely.        Follow Up Recommendations  Home health OT;Supervision/Assistance - 24 hour    Equipment Recommendations  None recommended by OT    Recommendations for Other Services       Precautions / Restrictions Precautions Precautions: Fall      Mobility Bed Mobility Overal bed mobility: Needs Assistance Bed Mobility: Supine to Sit;Sit to Supine     Supine to sit: Min assist Sit to supine: Mod assist   General bed mobility comments: Pt required increased time and effort as well as mod cues.  Assist to lift trunk as she stayed propped on elbow despite multiple cues to sit upright   Transfers Overall transfer level: Needs assistance Equipment used: None Transfers: Sit to/from Stand Sit to Stand: Mod assist         General transfer comment: heavy posterior lean     Balance Overall balance assessment: Needs assistance;History of Falls Sitting-balance support: Feet supported Sitting balance-Leahy Scale: Fair     Standing balance support: Single extremity  supported Standing balance-Leahy Scale: Poor                             ADL either performed or assessed with clinical judgement   ADL Overall ADL's : Needs assistance/impaired Eating/Feeding: Set up;Bed level   Grooming: Wash/dry hands;Wash/dry face;Oral care;Min guard;Sitting   Upper Body Bathing: Min guard;Sitting   Lower Body Bathing: Moderate assistance;Sit to/from stand   Upper Body Dressing : Minimal assistance;Sitting Upper Body Dressing Details (indicate cue type and reason): due to LOB while sitting  Lower Body Dressing: Moderate assistance;Sit to/from stand   Toilet Transfer: Moderate assistance;Stand-pivot;BSC   Toileting- Clothing Manipulation and Hygiene: Moderate assistance;Sit to/from stand       Functional mobility during ADLs: Moderate assistance General ADL Comments: Pt was fatigued during OT eval      Vision Baseline Vision/History: Wears glasses Wears Glasses: Distance only Patient Visual Report: No change from baseline Vision Assessment?: Yes Eye Alignment: Within Functional Limits Ocular Range of Motion: Within Functional Limits Visual Fields: Other (comment) Additional Comments: Pt noted to intermittently miss items on the Left during confrontation testing      Perception Perception Perception Tested?: Yes Perception Deficits: Inattention/neglect Inattention/Neglect: Does not attend to right visual field;Does not attend to left side of body Comments: questionable mild Lt inattention    Praxis      Pertinent Vitals/Pain Pain Assessment: Faces Faces Pain Scale: Hurts little more Pain Location: back- reports recent pulled muscle Pain Descriptors / Indicators: Sore Pain Intervention(s):  Monitored during session;Limited activity within patient's tolerance     Hand Dominance     Extremity/Trunk Assessment Upper Extremity Assessment Upper Extremity Assessment: LUE deficits/detail LUE Deficits / Details: Lt drift noted.   Strength grossly 4-/5 - 4/5    Lower Extremity Assessment Lower Extremity Assessment: Defer to PT evaluation   Cervical / Trunk Assessment Cervical / Trunk Assessment: Kyphotic   Communication Communication Communication: HOH   Cognition Arousal/Alertness: Awake/alert;Lethargic Behavior During Therapy: WFL for tasks assessed/performed Overall Cognitive Status: Within Functional Limits for tasks assessed                                 General Comments: WFL for basic info    General Comments       Exercises     Shoulder Instructions      Home Living Family/patient expects to be discharged to:: Assisted living                             Home Equipment: Walker - 2 wheels;Shower seat - built in          Prior Functioning/Environment Level of Independence: Needs assistance  Gait / Transfers Assistance Needed: rollator for apartment distance, [facility] for longer hallways ADL's / Homemaking Assistance Needed: staff assists with bath and does all the cooking and cleaning, pt dresses herself and goes to the beauty shop on Thursdays            OT Problem List: Decreased strength;Decreased activity tolerance;Impaired balance (sitting and/or standing);Impaired vision/perception;Decreased coordination;Decreased cognition;Decreased safety awareness;Impaired UE functional use      OT Treatment/Interventions: Self-care/ADL training;DME and/or AE instruction;Therapeutic activities;Visual/perceptual remediation/compensation;Patient/family education;Balance training;Neuromuscular education    OT Goals(Current goals can be found in the care plan section) Acute Rehab OT Goals Patient Stated Goal: go home OT Goal Formulation: With patient Time For Goal Achievement: 03/02/18 Potential to Achieve Goals: Good ADL Goals Pt Will Perform Grooming: with min guard assist;standing Pt Will Perform Upper Body Dressing: with set-up;sitting Pt Will Perform Lower Body  Dressing: with min assist;sit to/from stand Pt Will Transfer to Toilet: with min guard assist;ambulating;regular height toilet;bedside commode;grab bars Pt Will Perform Toileting - Clothing Manipulation and hygiene: with min guard assist;sit to/from stand Additional ADL Goal #1: Pt will locate items on Lt with no more than min cues  OT Frequency: Min 2X/week   Barriers to D/C:            Co-evaluation              AM-PAC PT "6 Clicks" Daily Activity     Outcome Measure Help from another person eating meals?: A Little Help from another person taking care of personal grooming?: A Little Help from another person toileting, which includes using toliet, bedpan, or urinal?: A Lot Help from another person bathing (including washing, rinsing, drying)?: A Lot Help from another person to put on and taking off regular upper body clothing?: A Little Help from another person to put on and taking off regular lower body clothing?: A Lot 6 Click Score: 15   End of Session Nurse Communication: Mobility status  Activity Tolerance: Patient limited by fatigue Patient left: in bed;with call bell/phone within reach  OT Visit Diagnosis: Unsteadiness on feet (R26.81);Hemiplegia and hemiparesis Hemiplegia - Right/Left: Left Hemiplegia - dominant/non-dominant: Non-Dominant Hemiplegia - caused by: Cerebral infarction  Time: 4098-1191 OT Time Calculation (min): 16 min Charges:  OT General Charges $OT Visit: 1 Visit OT Evaluation $OT Eval Moderate Complexity: 1 Mod G-Codes:     [employer], OTR/L [phone]   Jeani Hawking M 02/23/2018, 4:25 PM

## 2018-02-23 NOTE — Care Management Note (Signed)
Case Management Note  Patient Details  Name: Krista Cook MRN: [id number] Date of Birth: 05/12/1915  Subjective/Objective:   Pt admitted on 02/22/18 s/p stroke with TPA given.  PTA, [facility].               Action/Plan: PT recommending possible HH at discharge; will follow for dc planning as pt progresses.  [facility]    Expected Discharge Date:                  Expected Discharge Plan:  Assisted Living / Rest Home  In-House Referral:  Clinical Social Work  Discharge planning Services  CM Consult  Post Acute Care Choice:    Choice offered to:     DME Arranged:    DME Agency:     HH Arranged:    HH Agency:     Status of Service:  In process, will continue to follow  If discussed at Long Length of Stay Meetings, dates discussed:    Additional Comments:  Quintella BatonJulie W. Stokes Rattigan, RN, BSN  Trauma/Neuro ICU Case Manager [phone]

## 2018-02-23 NOTE — Progress Notes (Signed)
PT Cancellation Note  Patient Details Name: Theodoro DoingFrances Medel MRN: [id number] DOB: 12/07/1914   Cancelled Treatment:    Reason Eval/Treat Not Completed: Active bedrest order   Enedina FinnerMaija B Lazette Estala 02/23/2018, 7:18 AM Delaney MeigsMaija Tabor Jadeyn Hargett, PT [phone]'

## 2018-02-23 NOTE — Progress Notes (Signed)
Spoke with Krista pt's daughter (via phone) Krista Cook and Krista pt's grandaughter Krista Cook(Hospice counselor) in person.  Both expressed concerns about Krista pt's code status and said Krista pt would not want to be on a ventilator or experience chest compressions.  I explained that Dr. Roda ShuttersXu can discuss code status with Krista pt tomorrow during rounds and he can make her a DNR if that is what Krista pt verbalizes. Krista granddaughter said Krista pt is a spiritual person and would appreciate a chaplain visit.  I told Krista Krista daughter, Krista Hashimotoatricia, that Dr. Roda ShuttersXu can call her tomorrow after he talks with Krista pt.  She gave me her number [phone].

## 2018-02-23 NOTE — Progress Notes (Signed)
Daughter expressed concern about her mother being able to tolerate the MRIMRA due to back pain. Dr. Roda ShuttersXu made aware and canceled MRI/MRA for now and he will discuss with the daughter.

## 2018-02-23 NOTE — Evaluation (Signed)
Physical Therapy Evaluation Patient Details Name: Krista Cook MRN: [id number] DOB: 05/05/1915 Today's Date: 02/23/2018   History of Present Illness  [age] yo admitted with left weakness and slurred speech s/p tPA, MRI pending. PMhx: atrial fibrillation not on anticoagulation, COPD,CHF, peripheral neuropathy   Clinical Impression  Pt very pleasant, HOH and reports caring for herself with mobility at home with rollator with assist for homemaking and bathing. Pt reports equal strength with 3/5 bil LE did not attempt against resistance given frail skin and age, pt reports feeling back to normal without deficit. Pt moving well and encouraged increased activity with nursing. Pt with baseline generalized weakness, falls and limited gait who will benefit from acute therapy to maximize mobility, function, safety to return pt to PLOF and decrease burden of care.      Follow Up Recommendations Supervision for mobility/OOB;Other (comment);Home health PT(return to ALF with potential HHPT)    Equipment Recommendations  None recommended by PT    Recommendations for Other Services       Precautions / Restrictions Precautions Precautions: Fall      Mobility  Bed Mobility Overal bed mobility: Modified Independent             General bed mobility comments: pt with use of rail able to pivot to EOB with HOB 20 degrees and increased time  Transfers Overall transfer level: Needs assistance   Transfers: Sit to/from Stand Sit to Stand: Min guard         General transfer comment: guarding for safety and lines  Ambulation/Gait Ambulation/Gait assistance: Min guard Ambulation Distance (Feet): 20 Feet Assistive device: Rolling walker (2 wheeled) Gait Pattern/deviations: Step-through pattern;Decreased stride length;Trunk flexed   Gait velocity interpretation: Below normal speed for age/gender General Gait Details: pt with kyphotic posture with cues to attend to obstacles and for direction,  pt able to ambulate and advance RW without physical assist  Stairs            Wheelchair Mobility    Modified Rankin (Stroke Patients Only) Modified Rankin (Stroke Patients Only) Pre-Morbid Rankin Score: Moderate disability Modified Rankin: Moderate disability     Balance Overall balance assessment: Needs assistance;History of Falls   Sitting balance-Leahy Scale: Good       Standing balance-Leahy Scale: Poor                               Pertinent Vitals/Pain Pain Assessment: 0-10 Pain Score: 3  Pain Location: back- reports recent pulled muscle Pain Descriptors / Indicators: Sore Pain Intervention(s): Limited activity within patient's tolerance;Repositioned;Monitored during session    Home Living Family/patient expects to be discharged to:: Assisted living               Home Equipment: Walker - 2 wheels;Shower seat - built in      Prior Function Level of Independence: Needs assistance   Gait / Transfers Assistance Needed: rollator for apartment distance, [facility] for longer hallways  ADL's / Homemaking Assistance Needed: staff assists with bath and does all the cooking and cleaning, pt dresses herself and goes to the beauty shop on Thursdays        Hand Dominance        Extremity/Trunk Assessment   Upper Extremity Assessment Upper Extremity Assessment: Generalized weakness    Lower Extremity Assessment Lower Extremity Assessment: Generalized weakness(grossly 3/5 bil LE)    Cervical / Trunk Assessment Cervical / Trunk Assessment: Kyphotic  Communication  Communication: HOH  Cognition Arousal/Alertness: Awake/alert Behavior During Therapy: WFL for tasks assessed/performed Overall Cognitive Status: Within Functional Limits for tasks assessed                                        General Comments      Exercises     Assessment/Plan    PT Assessment Patient needs continued PT services  PT Problem List  Decreased strength;Decreased mobility;Decreased safety awareness;Decreased activity tolerance;Decreased knowledge of use of DME       PT Treatment Interventions DME instruction;Therapeutic activities;Gait training;Therapeutic exercise;Patient/family education;Functional mobility training    PT Goals (Current goals can be found in the Care Plan section)  Acute Rehab PT Goals Patient Stated Goal: go home PT Goal Formulation: With patient Time For Goal Achievement: 03/09/18 Potential to Achieve Goals: Good    Frequency Min 3X/week   Barriers to discharge        Co-evaluation               AM-PAC PT "6 Clicks" Daily Activity  Outcome Measure Difficulty turning over in bed (including adjusting bedclothes, sheets and blankets)?: A Little Difficulty moving from lying on back to sitting on the side of the bed? : A Little Difficulty sitting down on and standing up from a chair with arms (e.g., wheelchair, bedside commode, etc,.)?: A Little Help needed moving to and from a bed to chair (including a wheelchair)?: A Little Help needed walking in hospital room?: A Little Help needed climbing 3-5 steps with a railing? : A Lot 6 Click Score: 17    End of Session Equipment Utilized During Treatment: Gait belt Activity Tolerance: Patient tolerated treatment well Patient left: in chair;with chair alarm set;with call bell/phone within reach Nurse Communication: Mobility status;Precautions PT Visit Diagnosis: Other abnormalities of gait and mobility (R26.89);Muscle weakness (generalized) (M62.81);Difficulty in walking, not elsewhere classified (R26.2)    Time: 3235-5732 PT Time Calculation (min) (ACUTE ONLY): 22 min   Charges:   PT Evaluation $PT Eval Moderate Complexity: 1 Mod     PT G Codes:        Krista Cook, PT [phone]   Krista Cook 02/23/2018, 11:16 AM

## 2018-02-24 ENCOUNTER — Inpatient Hospital Stay (HOSPITAL_COMMUNITY): Payer: Medicare Other

## 2018-02-24 DIAGNOSIS — D508 Other iron deficiency anemias: Secondary | ICD-10-CM

## 2018-02-24 DIAGNOSIS — I340 Nonrheumatic mitral (valve) insufficiency: Secondary | ICD-10-CM

## 2018-02-24 LAB — BASIC METABOLIC PANEL
Anion gap: 12 (ref 5–15)
BUN: 20 mg/dL (ref 6–20)
CALCIUM: 8.4 mg/dL — AB (ref 8.9–10.3)
CHLORIDE: 99 mmol/L — AB (ref 101–111)
CO2: 22 mmol/L (ref 22–32)
CREATININE: 0.96 mg/dL (ref 0.44–1.00)
GFR calc Af Amer: 54 mL/min — ABNORMAL LOW (ref >60)
GFR calc non Af Amer: 47 mL/min — ABNORMAL LOW (ref >60)
Glucose, Bld: 104 mg/dL — ABNORMAL HIGH (ref 65–99)
Potassium: 4.1 mmol/L (ref 3.5–5.1)
SODIUM: 133 mmol/L — AB (ref 135–145)

## 2018-02-24 LAB — CBC
HCT: 24.6 % — ABNORMAL LOW (ref 36.0–46.0)
Hemoglobin: 7.6 g/dL — ABNORMAL LOW (ref 12.0–15.0)
MCH: 25.2 pg — AB (ref 26.0–34.0)
MCHC: 30.9 g/dL (ref 30.0–36.0)
MCV: 81.5 fL (ref 78.0–100.0)
PLATELETS: 286 10*3/uL (ref 150–400)
RBC: 3.02 MIL/uL — ABNORMAL LOW (ref 3.87–5.11)
RDW: 16.4 % — AB (ref 11.5–15.5)
WBC: 9.2 10*3/uL (ref 4.0–10.5)

## 2018-02-24 LAB — ECHOCARDIOGRAM COMPLETE
HEIGHTINCHES: 62 in
WEIGHTICAEL: 1824 [oz_av]

## 2018-02-24 LAB — HAPTOGLOBIN: HAPTOGLOBIN: 174 mg/dL (ref 34–200)

## 2018-02-24 MED ORDER — METOPROLOL SUCCINATE 50 MG PO CS24: 50.0000 mg | EXTENDED_RELEASE_CAPSULE | Freq: Every day | ORAL | Status: DC

## 2018-02-24 MED ORDER — DOCUSATE SODIUM 100 MG PO CAPS
100.0000 mg | ORAL_CAPSULE | Freq: Two times a day (BID) | ORAL | Status: DC
  Administered 2018-02-24 – 2018-02-27 (×7): 100 mg via ORAL
  Filled 2018-02-24 (×6): qty 1

## 2018-02-24 MED ORDER — APIXABAN 2.5 MG PO TABS: 2.5000 mg | ORAL_TABLET | Freq: Two times a day (BID) | ORAL | Status: DC

## 2018-02-24 MED ORDER — GABAPENTIN 400 MG PO CAPS
500.0000 mg | ORAL_CAPSULE | Freq: Every day | ORAL | Status: DC
  Administered 2018-02-24 – 2018-02-26 (×3): 500 mg via ORAL
  Filled 2018-02-24 (×3): qty 1

## 2018-02-24 NOTE — Progress Notes (Signed)
Visited with this patient today via Spiritual Care Consult.  Sat beside this patient in a chair to hear from her and see if there was anything she desired particular from Spiritual Care Department.  Patient expresses being tremendously grateful for her life and her family.  Patient given a prayer shawl of which became a topic of our discussion.  We continued our discussion of which am grateful.  Thankful for those that are taking care of her.     02/24/18 1103  Clinical Encounter Type  Visited With Patient;Health care provider  Visit Type Initial;Spiritual support

## 2018-02-24 NOTE — Progress Notes (Addendum)
STROKE TEAM PROGRESS NOTE   SUBJECTIVE (INTERVAL HISTORY) Patient up in the chair. Brighter and more interactive today. PT recommends return to [facility] with possible HH. Spoke with dtr who is a nurse who does not feel this is feasible for pt. [facility] in not an ALF but an independent apt that she rents. Patient responsible to ambulate independently in her room and get to the dining facility. She spends most of her time in her recliner and uses a walker to ambulate. She currently has some hired help, but it is limited to tranport to meals, bathing, dishes (max 1-2h day). She is totally alone from 5p-8a daily. 2 years ago, pt went to SNF for 2 weeks prior to return to [facility]. Dtr was looking into alternative care centers vs increased home help prior to stroke. Will ask PT to re-eval for d/c planning.   Pt has living will at home and "piece of paper on the refrigerator" not to resuscitate her. Agreeable to continue DNR in hospital. Order placed.   OBJECTIVE Vitals:   02/24/18 0400 02/24/18 0500 02/24/18 0600 02/24/18 0700  BP: 124/86 112/62 126/63 120/70  Pulse: 79 81 65 80  Resp: 18 (!) 21 20 15   Temp: 98.1 F (36.7 C)     TempSrc: Oral     SpO2: 97% 96% 99% 96%  Weight:      Height:        CBC:  Recent Labs  Lab 02/22/18 2034 02/23/18 1022 02/24/18 0320  WBC 10.0 9.3 9.2  NEUTROABS 5.9  --   --   HGB 7.0* 7.5* 7.6*  HCT 22.3* 23.6* 24.6*  MCV 80.8 83.4 81.5  PLT 288 291 286    Basic Metabolic Panel:  Recent Labs  Lab 02/23/18 1022 02/24/18 0320  NA 133* 133*  K 4.5 4.1  CL 102 99*  CO2 23 22  GLUCOSE 136* 104*  BUN 29* 20  CREATININE 1.15* 0.96  CALCIUM 8.6* 8.4*    Lipid Panel:     Component Value Date/Time   CHOL 138 02/23/2018 0334   TRIG 33 02/23/2018 0334   HDL 53 02/23/2018 0334   CHOLHDL 2.6 02/23/2018 0334   VLDL 7 02/23/2018 0334   LDLCALC 78 02/23/2018 0334   HgbA1c:  Lab Results  Component Value Date   HGBA1C 5.2  02/23/2018   Urine Drug Screen: No results found for: LABOPIA, COCAINSCRNUR, LABBENZ, AMPHETMU, THCU, LABBARB  Alcohol Level No results found for: ETH  IMAGING I have personally reviewed the radiological images below and agree with the radiology interpretations.  Ct Head Code Stroke Wo Contrast 02/22/2018 1. No acute intracranial infarct or other process identified. 2. ASPECTS is 10. 3. Generalized age-related cerebral atrophy with mild to moderate chronic small vessel ischemic disease.   Ct Head Wo Contrast 02/23/2018 Evolving nonhemorrhagic right basal ganglia infarct.   Dg Chest Port 1 View 02/23/2018 1. Cardiomegaly with vascular congestion and mild diffuse interstitial opacity consistent with edema 2. Small pleural effusions with bibasilar atelectasis or pneumonia.   Carotid Doppler   There is 1-39% bilateral ICA stenosis. Vertebral artery flow is antegrade.    MRI and MRA pending  TTE pending   PHYSICAL EXAM General - elderly thin but well developed, in no apparent distress, up in the chair. BUE dark bruising on on arms. Ophthalmologic - did not assess Cardiovascular - Irregular rate and rhythm, no tachycardia. Lung - clear bilaterally. Mental Status - Alert and oriented to person, place and year,  situation. Follows complex commands. Speech fluent, able to name and repeat.   Cranial Nerves II - XII - II - no field cut, PERRL III, IV, VI - extraocular movements intact. V - Facial sensation intact bilaterally. VII - mild left facial droop improved from yesterday, mild dysarthria. VIII - Mildly HOH (hearing aids) XI - SCMs intact XII - tongue midline  Motor Strength -  No UE drift. BUE grip strong. LUE 4+/5, RUE 5/5. R arm orbits L. LLE 4+/5, RLE 5/5. Muscle bulk decreased Reflexes - Did not assess Sensory - intact, symmetric bilaterally Coordination - no ataxia  Gait and Station - not tested.  No noted bleeding   ASSESSMENT/PLAN [name] is a [age]  y.o. female with history of AF not on AC, CHF, COPD and PN presenting with L dysphagia, L HP, L facial, L neglect. She received IV t-PA 02/22/2018 at 1758. She was not considered for endovascular intervention given age and risk.  Stroke:  R basal ganglia infarct embolic secondary to known atrial fibrillation not on AC, s/p IV tPA   Resultant  Mild L hp and L facial drooop   Code Stroke CT head no acute abnormality, ASPECTS 10, Small vessel disease. Atrophy.   MRI head canceled d/t back pain yesterday. Pt agreeable to try today. Dr. Roda ShuttersXu discussed with daughter.  MRA head canceled d/t back pain yesterday. Pt agreeable to try today. Dr. Roda ShuttersXu discussed with daughter.  Repeat CT shows R BG infarct  Carotid Doppler  B ICA 1-39% stenosis, VAs antegrade   2D Echo  pending   LDL 78  HgbA1c 5.2  SCDs for VTE prophylaxis Check puncture sites for bleeding or hematomas. Bleeding precautions Fall precautions  Diet regular Room service appropriate? Yes; Fluid consistency: Thin - passed SLP swallow assessment for regular diet.  aspirin 81 mg daily prior to admission, started on aspirin 81 mg daily yesterday afternoon as hgb stabilized. Plan eliquis in am. (see AF discussion below)  Ongoing aggressive stroke risk factor management  Therapy recommendations:  HH OT, PT  Disposition:  pending ([facility] [place] w/ some hired assistance with transport to meals & bath max 1-2h/d)  Transfer to the floor  Have PT reassess for d/c needs - ? ST SNF rehab prior to return to ILF vs other recommendations  DNR as PTA  Atrial Fibrillation  Home anticoagulation:  No antithrombotic. Had been on warfarin in the past but was d/c'd d/t fall risk 5 years ago. Patient reports she did not fall. dtr reports 2 recent falls but not severe   On metoprolol 50 daily  CHA2DS2-VASc Score = 6, (?2 oral anticoagulation recommended)  Age in Years:  ?[age]   +2    Sex:  Female   +1     Hypertension History:  0     Diabetes Mellitus:  0  Congestive Heart Failure History:  +1  Vascular Disease History:  0     Stroke/TIA/Thromboembolism History:  yes   +2  Given improving hgb, will start eliquis 2.5 mg daily tomorrow as long as she remains stable. Placed order. Continue aspirin 81 mg daily.  Home metoprolol resumed  Hyperlipidemia  Home meds:  No statin  LDL 78, goal < 70  Add low dose statin given age  Continue statin at discharge  Fe Deficiency Anemia  Hgb 8.2 on admission ->7.0.>7.6. Hgb 12 in Sept 2019  B arms dark ecchymosis, no active bleeding noted  Fer 10, barely low  Fe 12. low  Normal:  folate, haptoglobin, LDH, Vit B12, TIBC  Fe added 325 tid  On colace at home bid, will resume IP  Daughter stated that pt not eating well at home  Nutrition on board  Other Stroke Risk Factors  Advanced age  UDS / ETOH level not performed   Hx CHF  Other Active Problems  PN on  neurontin  COPD  Renal insufficiency, resolved 34/1.4->20/0.96  Hx back pain  Hospital day # 2  Rhoderick Moody [facility] See Amion for Pager information 02/24/2018 8:09 AM   ATTENDING NOTE: I reviewed above note and agree with the assessment and plan. I have made any additions or clarifications directly to the above note. Pt was seen and examined.   84 year old female with history of atrial fibrillation off Coumadin for 5 years due to fall risk, COPD, CHF admitted for acute onset slurred speech, left-sided weakness, left facial droop and left neglect.  CT negative for bleeding.  Status post TPA.  CUS unremarkable. Currently pending MRI/MRA/TTE.  A1c 5.2 LDL 78. Discussed with daughter and pt, they both agree to attempt MRI/MRA today.   Patient stroke most likely due to A. fib not on anticoagulation.  Patient was taken off Coumadin 5 years ago likely due to fall risk. However, pt seldom falls (one for the last 5 years as per pt). It is reasonable  to consider Eliquis 2.5 twice daily for stroke prevention. Will start tomorrow given small to moderate size of stroke. Will see the size on MRI also today if able to perform.  Patient anemic improved, work up consistent with iron deficiency. On iron pill and Hb improved. Felt OK to take ASA for now.    Pt neuro stable, left facial droop and left mild hp improved from yesterday. Will transfer to floor today. PT/OT final recommendation pending as pt lives at home with some help but not 24/7 supervision. Consider SNF if needed and I have talked with pt and daughter and they are Ok with it. Clarified with code status, and ordered DNR for her.   Marvel Plan, MD PhD Stroke Neurology 02/24/2018 12:19 PM  To contact Stroke Continuity provider, please refer to [email or web address]. After hours, contact General Neurology

## 2018-02-24 NOTE — Progress Notes (Signed)
  Echocardiogram 2D Echocardiogram has been performed.  Krista Cook T Krista Cook 02/24/2018, 2:45 PM

## 2018-02-24 NOTE — Progress Notes (Signed)
Occupational Therapy Treatment Patient Details Name: Krista Cook MRN: [id number] DOB: [birth year] Today's Date: 02/24/2018    History of present illness 84 yo admitted with left weakness and slurred speech s/p tPA, CT of head showed Evolving nonhemorrhagic right basal ganglia infarct.  PMhx: atrial fibrillation not on anticoagulation, COPD,CHF, peripheral neuropathy    OT comments  Pt significantly improved today.  She requires min a for ADLs, and min guard for functional ambulation and transfers.  No evidence of Lt inattention noted. She does fatigue quickly with activity, and c/o back pain.  Apparently, pt does not live in an ALF, but an independent living apartment.  At this time, do not feel she is safe to return home alone, and is high risk for falls.  Feel she would benefit from SNF level rehab, and likely transition to ALF vs increased caregiver support at ILF.  Will follow acutely.   Follow Up Recommendations  SNF    Equipment Recommendations  None recommended by OT    Recommendations for Other Services      Precautions / Restrictions Precautions Precautions: Fall       Mobility Bed Mobility Overal bed mobility: Needs Assistance Bed Mobility: Supine to Sit;Sit to Supine     Supine to sit: Min guard;HOB elevated Sit to supine: Min assist   General bed mobility comments: assist to lift LEs into bed   Transfers Overall transfer level: Needs assistance Equipment used: Rolling walker (2 wheeled) Transfers: Sit to/from [employer] Sit to Stand: Min guard Stand pivot transfers: Min guard       General transfer comment: min guard to steady     Balance Overall balance assessment: Needs assistance;History of Falls Sitting-balance support: Feet supported Sitting balance-Leahy Scale: Fair     Standing balance support: Single extremity supported;During functional activity;No upper extremity supported Standing balance-Leahy Scale: Poor Standing  balance comment: requires UE support                            ADL either performed or assessed with clinical judgement   ADL Overall ADL's : Needs assistance/impaired     Grooming: Wash/dry hands;Wash/dry face;Brushing hair;Min guard;Standing           Upper Body Dressing : Set up;Sitting   Lower Body Dressing: Set up;Minimal assistance;Sit to/from stand Lower Body Dressing Details (indicate cue type and reason): assist with socks  Toilet Transfer: Min guard;Ambulation;Comfort height toilet;RW;Grab bars   Toileting- Clothing Manipulation and Hygiene: Minimal assistance;Sit to/from stand       Functional mobility during ADLs: Min guard;Rolling walker General ADL Comments: pt fatigues quickly.  She requires min guard assist for safety      Vision   Additional Comments: Pt locates items on LT on her tray and on the sink.  No inattention noted this date    Perception     Praxis      Cognition Arousal/Alertness: Awake/alert;Lethargic Behavior During Therapy: WFL for tasks assessed/performed Overall Cognitive Status: Within Functional Limits for tasks assessed                                          Exercises     Shoulder Instructions       General Comments Per RN, pt does not live in ALF, but lives in ILF, and has caregiver for 2 hours in the am  only.   Discussed need for SNF level rehab with pt, and she noded agreement     Pertinent Vitals/ Pain       Pain Assessment: Faces Faces Pain Scale: Hurts little more Pain Location: back- reports recent pulled muscle Pain Descriptors / Indicators: Sore Pain Intervention(s): Monitored during session;Repositioned;Limited activity within patient's tolerance  Home Living                                          Prior Functioning/Environment              Frequency  Min 2X/week        Progress Toward Goals  OT Goals(current goals can now be found in the care  plan section)  Progress towards OT goals: Progressing toward goals     Plan Discharge plan needs to be updated    Co-evaluation                 AM-PAC PT "6 Clicks" Daily Activity     Outcome Measure   Help from another person eating meals?: None Help from another person taking care of personal grooming?: A Little Help from another person toileting, which includes using toliet, bedpan, or urinal?: A Little Help from another person bathing (including washing, rinsing, drying)?: A Little Help from another person to put on and taking off regular upper body clothing?: A Little Help from another person to put on and taking off regular lower body clothing?: A Little 6 Click Score: 19    End of Session Equipment Utilized During Treatment: Gait belt;Rolling walker  OT Visit Diagnosis: Unsteadiness on feet (R26.81) Hemiplegia - caused by: Cerebral infarction   Activity Tolerance Patient limited by fatigue   Patient Left in bed;with call bell/phone within reach;with bed alarm set   Nurse Communication Mobility status        Time: 1340-1402 OT Time Calculation (min): 22 min  Charges: OT General Charges $OT Visit: 1 Visit OT Treatments $Self Care/Home Management : 8-22 mins  Reynolds AmericanWendi Rankin Coolman, OTR/L [phone]    Jeani HawkingConarpe, Eitan Doubleday M 02/24/2018, 2:48 PM

## 2018-02-25 LAB — BASIC METABOLIC PANEL
ANION GAP: 8 (ref 5–15)
BUN: 15 mg/dL (ref 6–20)
CHLORIDE: 104 mmol/L (ref 101–111)
CO2: 23 mmol/L (ref 22–32)
Calcium: 8.1 mg/dL — ABNORMAL LOW (ref 8.9–10.3)
Creatinine, Ser: 0.85 mg/dL (ref 0.44–1.00)
GFR calc Af Amer: >60 mL/min (ref >60)
GFR, EST NON AFRICAN AMERICAN: 54 mL/min — AB (ref >60)
Glucose, Bld: 89 mg/dL (ref 65–99)
POTASSIUM: 3.7 mmol/L (ref 3.5–5.1)
SODIUM: 135 mmol/L (ref 135–145)

## 2018-02-25 LAB — CBC
HEMATOCRIT: 21.5 % — AB (ref 36.0–46.0)
HEMOGLOBIN: 6.7 g/dL — AB (ref 12.0–15.0)
MCH: 25.9 pg — ABNORMAL LOW (ref 26.0–34.0)
MCHC: 31.2 g/dL (ref 30.0–36.0)
MCV: 83 fL (ref 78.0–100.0)
Platelets: 230 10*3/uL (ref 150–400)
RBC: 2.59 MIL/uL — AB (ref 3.87–5.11)
RDW: 16.8 % — ABNORMAL HIGH (ref 11.5–15.5)
WBC: 7.8 10*3/uL (ref 4.0–10.5)

## 2018-02-25 LAB — HEMOGLOBIN: Hemoglobin: 10.5 g/dL — ABNORMAL LOW (ref 12.0–15.0)

## 2018-02-25 LAB — PREPARE RBC (CROSSMATCH)

## 2018-02-25 LAB — OCCULT BLOOD X 1 CARD TO LAB, STOOL: Fecal Occult Bld: POSITIVE — AB

## 2018-02-25 LAB — ABO/RH: ABO/RH(D): O POS

## 2018-02-25 MED ORDER — FUROSEMIDE 10 MG/ML IJ SOLN
20.0000 mg | Freq: Once | INTRAMUSCULAR | Status: AC
  Administered 2018-02-25: 20 mg via INTRAVENOUS
  Filled 2018-02-25: qty 4

## 2018-02-25 MED ORDER — BISACODYL 10 MG RE SUPP
10.0000 mg | Freq: Once | RECTAL | Status: AC
  Administered 2018-02-25: 10 mg via RECTAL

## 2018-02-25 MED ORDER — BISACODYL 10 MG RE SUPP
10.0000 mg | Freq: Once | RECTAL | Status: AC
  Filled 2018-02-25: qty 1

## 2018-02-25 MED ORDER — FUROSEMIDE 10 MG/ML IJ SOLN
20.0000 mg | Freq: Once | INTRAMUSCULAR | Status: DC
  Filled 2018-02-25: qty 4

## 2018-02-25 MED ORDER — SODIUM CHLORIDE 0.9 % IV SOLN
Freq: Once | INTRAVENOUS | Status: AC
  Administered 2018-02-25: 12:00:00 via INTRAVENOUS

## 2018-02-25 NOTE — Progress Notes (Signed)
2 units of ordered blood transfused with no s/s of reaction noted. Pt resting comfortably in bed with call light within reach. Will continue to closely monitor. Dionne BucyP. Amo Imogen Maddalena RN

## 2018-02-25 NOTE — Significant Event (Signed)
CRITICAL VALUE STICKER  CRITICAL VALUE: Hemoglobin 6.7  RECEIVER (on-site recipient of call):  DATE & TIME NOTIFIED: 02/25/18 0705  MESSENGER (representative from lab):  MD NOTIFIED: S. Aroor  TIME OF NOTIFICATION: 0712  RESPONSE: Text paging. Awaiting.

## 2018-02-25 NOTE — Evaluation (Signed)
Speech Language Pathology Evaluation Patient Details Name: Krista Cook MRN: [id number] DOB: 01/30/1915 Today's Date: 02/25/2018 Time: [id number] SLP Time Calculation (min) (ACUTE ONLY): 13 min  Problem List:  Patient Active Problem List   Diagnosis Date Noted  . Stroke (cerebrum) (HCC) 02/22/2018   Past Medical History: History reviewed. No pertinent past medical history. Past Surgical History: History reviewed. No pertinent surgical history. HPI:  Krista Cook an [age] y.o.femalea past medical history of atrial fibrillation not on anticoagulation, COPD,CHF peripheral neuropathy presents as a stroke alert.Patient lives in assisted living facility and per caretaker last seen normal was 4:30 PM. She was then noted to have sudden onset dysarthria and left-sided weakness. . CT head showed no acute findings and aspects 10. NIHSS was 13. She received iv TPA  Assessment / Plan / Recommendation Clinical Impression  Pt presents with clear speech without dysarthria; fluent expressive language, good comprehension, functional auditory attention and improved left visual attention.  She makes needs/ideas known; demonstrates sense of humor and motivation to be D/Cd. Demonstrates mild short-term memory deficits - baseline unknown but difficulty isn't inconsistent with advanced age. She has no significant deficits that require SLP intervention.  Our services will sign off.      SLP Assessment  SLP Recommendation/Assessment: Patient does not need any further Speech Lanaguage Pathology Services    Follow Up Recommendations  None    Frequency and Duration           SLP Evaluation Cognition  Overall Cognitive Status: Within Functional Limits for tasks assessed Arousal/Alertness: Awake/alert Orientation Level: Oriented X4 Attention: Sustained Sustained Attention: Appears intact Problem Solving: Appears intact       Comprehension  Auditory Comprehension Overall Auditory Comprehension:  Appears within functional limits for tasks assessed    Expression Expression Primary Mode of Expression: Verbal Verbal Expression Overall Verbal Expression: Appears within functional limits for tasks assessed Written Expression Dominant Hand: Right Written Expression: Not tested   Oral / Motor  Oral Motor/Sensory Function Overall Oral Motor/Sensory Function: Within functional limits Motor Speech Overall Motor Speech: Appears within functional limits for tasks assessed   GO                    [name], [name] 02/25/2018, 3:26 PM

## 2018-02-25 NOTE — NC FL2 (Signed)
[place] MEDICAID FL2 LEVEL OF CARE SCREENING TOOL     IDENTIFICATION  Patient Name: Krista Cook Birthdate: 1915-11-21 Sex: female Admission Date (Current Location): 02/22/2018  [facility] and [place] Number:  Producer, television/film/video and Address:  [facility], [address] [place], [place] [address]      Provider Number: [id number]  Attending Physician Name and Address:  Marvel Plan, MD  Relative Name and Phone Number:       Current Level of Care: Hospital Recommended Level of Care: Skilled Nursing Facility Prior Approval Number:    Date Approved/Denied:   PASRR Number: [id number]  Discharge Plan: SNF    Current Diagnoses: Patient Active Problem List   Diagnosis Date Noted  . Stroke (cerebrum) (HCC) 02/22/2018    Orientation RESPIRATION BLADDER Height & Weight     Self, Time, Situation, Place  Normal Continent, External catheter(catheter placed 02/22/18) Weight: 114 lb (51.7 kg)(Simultaneous filing. User may not have seen previous data.) Height:  5\' 2"  (157.5 cm)  BEHAVIORAL SYMPTOMS/MOOD NEUROLOGICAL BOWEL NUTRITION STATUS      Continent Diet(regular)  AMBULATORY STATUS COMMUNICATION OF NEEDS Skin   Limited Assist Verbally Normal                       Personal Care Assistance Level of Assistance  Bathing, Feeding, Dressing Bathing Assistance: Limited assistance Feeding assistance: Independent Dressing Assistance: Limited assistance     Functional Limitations Info  Sight, Hearing, Speech Sight Info: Impaired(glasses) Hearing Info: Impaired(hearing aids) Speech Info: Adequate    SPECIAL CARE FACTORS FREQUENCY  PT (By licensed PT), OT (By licensed OT)     PT Frequency: 5x/wk OT Frequency: 5x/wk            Contractures Contractures Info: Not present    Additional Factors Info  Code Status, Allergies Code Status Info: DNR Allergies Info: Celebrex Celecoxib, Ultram Tramadol           Current Medications  (02/25/2018):  This is the current hospital active medication list Current Facility-Administered Medications  Medication Dose Route Frequency Provider Last Rate Last Dose  .  stroke: mapping our early stages of recovery book   Does not apply Once Annie Main L, NP      . albuterol (PROVENTIL) (2.5 MG/3ML) 0.083% nebulizer solution 3 mL  3 mL Inhalation Q4H PRN Layne Benton, NP      . atorvastatin (LIPITOR) tablet 10 mg  10 mg Oral q1800 Layne Benton, NP   10 mg at 02/24/18 1623  . docusate sodium (COLACE) capsule 100 mg  100 mg Oral BID Annie Main L, NP   100 mg at 02/25/18 1038  . ferrous sulfate tablet 325 mg  325 mg Oral TID WC Layne Benton, NP   325 mg at 02/25/18 1239  . furosemide (LASIX) injection 20 mg  20 mg Intravenous Once Layne Benton, NP      . furosemide (LASIX) injection 20 mg  20 mg Intravenous Once Annie Main L, NP      . gabapentin (NEURONTIN) capsule 500 mg  500 mg Oral QHS Layne Benton, NP   500 mg at 02/24/18 2215  . metoprolol succinate (TOPROL-XL) 24 hr tablet 25 mg  25 mg Oral Daily Annie Main L, NP   25 mg at 02/25/18 1034  . mometasone-formoterol (DULERA) 100-5 MCG/ACT inhaler 1 puff  1 puff Inhalation BID Layne Benton, NP   1 puff at 02/24/18 0807  .  pantoprazole (PROTONIX) EC tablet 40 mg  40 mg Oral Daily Layne BentonBiby, Sharon L, NP   40 mg at 02/25/18 1034     Discharge Medications: Please see discharge summary for a list of discharge medications.  Relevant Imaging Results:  Relevant Lab Results:   Additional Information SS#: [id number]  Baldemar LenisElizabeth M Demeco Ducksworth, LCSW

## 2018-02-25 NOTE — Progress Notes (Signed)
PT Cancellation Note  Patient Details Name: Theodoro DoingFrances Witte MRN: [id number] DOB: 03/19/1915   Cancelled Treatment:    Reason Eval/Treat Not Completed: Medical issues which prohibited therapy. Hemoglobin 6.8. Will follow up after transfusion.  Laurina Bustlearoline Marquis Down, PT, DPT Acute Rehabilitation Services  Pager: [phone]    Vanetta MuldersCarloine H Satia Winger 02/25/2018, 11:27 AM

## 2018-02-25 NOTE — Consult Note (Signed)
[facility] Consultation Note  Referring Provider: Dr. Marvel PlanJindong Xu Primary Care Physician:  Loyal JacobsonKalish, Michael, MD  Reason for Consultation:  Anemia, hemoccult positive stools.  HPI: Krista Cook is a [age] y.o. female admitted for stroke.  No anticoagulation at time of admission (other than baby aspirin), placed on anticoagulation upon presentation, now with interval anemia and hemoccult positive stools.  Appears off anticoagulation now.  No nausea, vomiting, abdominal pain, overt blood in stool, hematemesis, change in bowel habits, unintentional weight loss.  Denies prior endoscopy or colonoscopy.   History reviewed. No pertinent past medical history.  History reviewed. No pertinent surgical history.  Prior to Admission medications   Medication Sig Start Date End Date Taking? Authorizing Provider  albuterol (PROVENTIL HFA;VENTOLIN HFA) 108 (90 Base) MCG/ACT inhaler Inhale 2 puffs into the lungs every 4 (four) hours as needed for wheezing or shortness of breath.   Yes [provider]  aspirin 81 MG EC tablet Take 81 mg by mouth daily. Swallow whole.   Yes [provider]  furosemide (LASIX) 20 MG tablet Take 20-40 mg by mouth See admin instructions. 20 mg in the morning and 40 mg in the evening   Yes [provider]  gabapentin (NEURONTIN) 100 MG capsule Take 500 mg by mouth at bedtime.   Yes [provider]  Metoprolol Succinate 50 MG CS24 Take 50 mg by mouth daily.   Yes [provider]  mometasone-formoterol (DULERA) 100-5 MCG/ACT AERO Inhale 1 puff into the lungs 2 (two) times daily.   Yes [provider]  moxifloxacin (VIGAMOX) 0.5 % ophthalmic solution Place 1 drop into both eyes 3 (three) times daily.   Yes [provider]  omeprazole (PRILOSEC) 40 MG capsule Take 40 mg by mouth daily.   Yes [provider]  travoprost, benzalkonium, (TRAVATAN) 0.004 % ophthalmic solution Place 1 drop into both eyes at  bedtime.   Yes [provider]    Current Facility-Administered Medications  Medication Dose Route Frequency Provider Last Rate Last Dose  .  stroke: mapping our early stages of recovery book   Does not apply Once Annie MainBiby, Sharon L, NP      . albuterol (PROVENTIL) (2.5 MG/3ML) 0.083% nebulizer solution 3 mL  3 mL Inhalation Q4H PRN Layne BentonBiby, Sharon L, NP      . atorvastatin (LIPITOR) tablet 10 mg  10 mg Oral q1800 Layne BentonBiby, Sharon L, NP   10 mg at 02/24/18 1623  . docusate sodium (COLACE) capsule 100 mg  100 mg Oral BID Annie MainBiby, Sharon L, NP   100 mg at 02/25/18 1038  . ferrous sulfate tablet 325 mg  325 mg Oral TID WC Layne BentonBiby, Sharon L, NP   325 mg at 02/25/18 1239  . furosemide (LASIX) injection 20 mg  20 mg Intravenous Once Layne BentonBiby, Sharon L, NP      . furosemide (LASIX) injection 20 mg  20 mg Intravenous Once Annie MainBiby, Sharon L, NP      . gabapentin (NEURONTIN) capsule 500 mg  500 mg Oral QHS Layne BentonBiby, Sharon L, NP   500 mg at 02/24/18 2215  . metoprolol succinate (TOPROL-XL) 24 hr tablet 25 mg  25 mg Oral Daily Annie MainBiby, Sharon L, NP   25 mg at 02/25/18 1034  . mometasone-formoterol (DULERA) 100-5 MCG/ACT inhaler 1 puff  1 puff Inhalation BID Layne BentonBiby, Sharon L, NP   1 puff at 02/24/18 0807  . pantoprazole (PROTONIX) EC tablet 40 mg  40 mg Oral Daily Layne BentonBiby, Sharon L, NP  40 mg at 02/25/18 1034    Allergies as of 02/22/2018 - Review Complete 02/22/2018  Allergen Reaction Noted  . Celebrex [celecoxib]  02/22/2018  . Ultram [tramadol]  02/22/2018    History reviewed. No pertinent family history.  Social History   Socioeconomic History  . Marital status: Widowed    Spouse name: Not on file  . Number of children: Not on file  . Years of education: Not on file  . Highest education level: Not on file  Occupational History  . Not on file  Social Needs  . Financial resource strain: Not on file  . Food insecurity:    Worry: Not on file    Inability: Not on file  . Transportation needs:    Medical: Not on  file    Non-medical: Not on file  Tobacco Use  . Smoking status: Never Smoker  . Smokeless tobacco: Never Used  Substance and Sexual Activity  . Alcohol use: Not on file  . Drug use: Not on file  . Sexual activity: Not on file  Lifestyle  . Physical activity:    Days per week: Not on file    Minutes per session: Not on file  . Stress: Not on file  Relationships  . Social connections:    Talks on phone: Not on file    Gets together: Not on file    Attends religious service: Not on file    Active member of club or organization: Not on file    Attends meetings of clubs or organizations: Not on file    Relationship status: Not on file  . Intimate partner violence:    Fear of current or ex partner: Not on file    Emotionally abused: Not on file    Physically abused: Not on file    Forced sexual activity: Not on file  Other Topics Concern  . Not on file  Social History Narrative  . Not on file    Review of Systems: As per HPI, all others negative  Physical Exam: Vital signs in last 24 hours: Temp:  [98.3 F (36.8 C)-99.2 F (37.3 C)] 99.2 F (37.3 C) (03/28 1311) Pulse Rate:  [76-96] 77 (03/28 1311) Resp:  [18-25] 18 (03/28 1311) BP: (96-122)/(41-64) 106/41 (03/28 1311) SpO2:  [94 %-99 %] 96 % (03/28 1311)   General:   Alert, thin and cachectic-appearing but remarkably spry and friendly, NAD Head:  Normocephalic and atraumatic. Eyes:  Sclera clear, no icterus.   Conjunctiva pale Ears:  Significant presbyacusis Nose:  No deformity, discharge,  or lesions. Mouth:  No deformity or lesions.  Oropharynx pink & moist. Neck:  Supple; no masses or thyromegaly. Lungs:  Clear throughout to auscultation.   No wheezes, crackles, or rhonchi. No acute distress. Heart:  Irregularly irregular; no murmurs, clicks, rubs,  or gallops. Abdomen:  Soft, nontender and nondistended. No masses, hepatosplenomegaly or hernias noted. Normal bowel sounds, without guarding, and without rebound.      Msk:  Symmetrical without gross deformities. Normal posture. Pulses:  Normal pulses noted. Extremities:  Without clubbing or edema. Neurologic:  Alert and  oriented x4; diffusely weak, left-sided weakness; no dysarthria Skin:  Significant and diffuse scattered ecchymoses, especially on extremities; ntact without significant lesions or rashes. Psych:  Alert and cooperative. Normal mood and affect.   Lab Results: Recent Labs    02/23/18 1022 02/24/18 0320 02/25/18 0520  WBC 9.3 9.2 7.8  HGB 7.5* 7.6* 6.7*  HCT 23.6* 24.6* 21.5*  PLT  291 286 230   BMET Recent Labs    02/23/18 1022 02/24/18 0320 02/25/18 0520  NA 133* 133* 135  K 4.5 4.1 3.7  CL 102 99* 104  CO2 23 22 23   GLUCOSE 136* 104* 89  BUN 29* 20 15  CREATININE 1.15* 0.96 0.85  CALCIUM 8.6* 8.4* 8.1*   LFT Recent Labs    02/22/18 1739  PROT 5.7*  ALBUMIN 3.0*  AST 19  ALT 8*  ALKPHOS 76  BILITOT 0.7   PT/INR Recent Labs    02/22/18 1739  LABPROT 14.4  INR 1.13    Studies/Results: Ct Head Wo Contrast  Result Date: 02/23/2018 CLINICAL DATA:  Dysarthria and left-sided weakness. EXAM: CT HEAD WITHOUT CONTRAST TECHNIQUE: Contiguous axial images were obtained from the base of the skull through the vertex without intravenous contrast. COMPARISON:  02/22/2018 FINDINGS: Brain: There is a developing region of low density measuring approximately 2.5 cm in the right basal ganglia involving both the lentiform and caudate nuclei as well as anterior limb of the internal capsule. There is no associated hemorrhage or mass effect. Patchy cerebral white matter hypodensities bilaterally are nonspecific but compatible with mild-to-moderate chronic small vessel ischemic disease. There is mild cerebral atrophy. No mass, midline shift, or extra-axial fluid collection is present. Vascular: Calcified atherosclerosis at the skull base. No hyperdense vessel. Skull: No fracture or focal osseous lesion. Sinuses/Orbits: Visualized  paranasal sinuses and mastoid air cells are clear. Bilateral cataract extraction is noted. Other: None. IMPRESSION: Evolving nonhemorrhagic right basal ganglia infarct. Electronically Signed   By: Sebastian Ache M.D.   On: 02/23/2018 15:21   Dg Chest Port 1 View  Result Date: 02/23/2018 CLINICAL DATA:  Stroke EXAM: PORTABLE CHEST 1 VIEW COMPARISON:  08/20/2017 FINDINGS: Small bilateral pleural effusions. Left greater than right bibasilar airspace disease. Cardiomegaly with vascular congestion and mild diffuse edema. Aortic atherosclerosis. No pneumothorax. IMPRESSION: 1. Cardiomegaly with vascular congestion and mild diffuse interstitial opacity consistent with edema 2. Small pleural effusions with bibasilar atelectasis or pneumonia. Electronically Signed   By: Jasmine Pang M.D.   On: 02/23/2018 19:55    Impression:  1.  Recent stroke. 2.  Atrial fibrillation, not on anticoagulation. 3.  CHF, COPD, multiple comorbidities. 4.  Anemia with hemoccult-positive stools.  No overt GI bleeding.  Plan:  1.  Supportive care:  PPI, serial CBCs, blood transfusions as needed. 2.  Patient is very high risk for complications from endoscopic procedures, given her cardiac comorbidities and advanced age.  Significant complications from endoscopic procedures, such as perforation, would almost assuredly be fatal. 3.  Will monitor supportively; would not do endoscopy unless patient has ongoing overt and destabilizing GI bleeding.  Would not do endoscopy merely for anemia and hemoccult-positive stools. 4.  Case discussed with patient and her grandson (which is at bedside), and they are in agreement with this approach. 5.  [facility] will follow.   LOS: 3 days   Fahd Galea M  02/25/2018, 1:59 PM  Cell [phone] If no answer or after 5 PM call [phone]

## 2018-02-25 NOTE — Progress Notes (Addendum)
Stool guaiac positive in setting of anemia.  Hematology consult canceled. [name]([name])  GI consult requested ([name])  Krista MainSharon [name], MSN, APRN, ANVP-BC, AGPCNP-BC Advanced Practice Stroke Nurse [facility] See Amion for Schedule & Pager information 02/25/2018 12:16 PM

## 2018-02-25 NOTE — Care Management Note (Signed)
Case Management Note  Patient Details  Name: Theodoro DoingFrances Everitt MRN: [id number] Date of Birth: 03/01/1915  Subjective/Objective:   Pt admitted on 02/22/18 s/p stroke with TPA given.  PTA, [facility].               Action/Plan: PT recommending possible HH at discharge; will follow for dc planning as pt progresses.  [facility]    Expected Discharge Date:                  Expected Discharge Plan:  Skilled Nursing Facility  In-House Referral:  Clinical Social Work  Discharge planning Services  CM Consult  Post Acute Care Choice:    Choice offered to:     DME Arranged:    DME Agency:     HH Arranged:    HH Agency:     Status of Service:  In process, will continue to follow  If discussed at Long Length of Stay Meetings, dates discussed:    Additional Comments:  02/24/18 J. Chioke Noxon,RN, BSN Notified by bedside RN that granddaughter states pt does not have 24h physical assistance at retirement community, and will likely need SNF at dc, due to lack of physical support.  Will consult CSW to facilitate likely dc to SNF upon medical stability.    Quintella BatonJulie W. Iqra Rotundo, RN, BSN  Trauma/Neuro ICU Case Manager [phone]

## 2018-02-25 NOTE — Clinical Social Work Note (Signed)
Clinical Social Work Assessment  Patient Details  Name: Krista Cook MRN: [id number] Date of Birth: 01-09-1915  Date of referral:  02/25/18               Reason for consult:  Facility Placement                Permission sought to share information with:  Facility Sport and exercise psychologist, Family Supports Permission granted to share information::  Yes, Verbal Permission Granted  Name::     Actuary::  SNF  Relationship::  Daughter  Contact Information:     Housing/Transportation Living arrangements for the past 2 months:  Charity fundraiser of Information:  Patient, Adult Children Patient Interpreter Needed:  None Criminal Activity/Legal Involvement Pertinent to Current Situation/Hospitalization:  No - Comment as needed Significant Relationships:  Adult Children, Other Family Members Lives with:  Self Do you feel safe going back to the place where you live?  Yes Need for family participation in patient care:  No (Coment)  Care giving concerns:  Patient resides in independent living at [facility], but will need short term rehab at discharge prior to returning back to her independent living apartment.   Social Worker assessment / plan:  CSW met with patient to discuss short term rehab at discharge. CSW contacted patient's daughter via phone because patient was not understanding what CSW was saying. CSW discussed SNF with patient's daughter and discussed facility options. CSW faxed out referral and will follow up with facility preferences.  Employment status:  Retired Forensic scientist:  Medicare PT Recommendations:  Home with [place] / Referral to community resources:  [place]  Patient/Family's Response to care:  Patient and daughter agreeable to SNF.  Patient/Family's Understanding of and Emotional Response to Diagnosis, Current Treatment, and Prognosis:  Patient was having difficulty hearing and understanding  conversation, as she said that she wanted [facility] and had been there before but wasn't understanding that SNF was being recommended. Patient's daughter discussed how you have to get close to the patient's ear to talk to her, but that she will need SNF at discharge. Patient's daughter would like the patient to be closed close to [place] where the family is located.  Emotional Assessment Appearance:  Appears stated age Attitude/Demeanor/Rapport:  Engaged Affect (typically observed):  Pleasant Orientation:  Oriented to Self, Oriented to Place, Oriented to  Time, Oriented to Situation Alcohol / Substance use:  Not Applicable Psych involvement (Current and /or in the community):  No (Comment)  Discharge Needs  Concerns to be addressed:  Care Coordination Readmission within the last 30 days:  No Current discharge risk:  Lives alone, Dependent with Mobility Barriers to Discharge:  Continued Medical Work up   [employer], [place] 02/25/2018, 4:23 PM

## 2018-02-25 NOTE — Progress Notes (Addendum)
STROKE TEAM PROGRESS NOTE   SUBJECTIVE (INTERVAL HISTORY) Hgb down over night to 6.7. Patient states she feels ok, maybe even better than yesterday. Just received a bath and dulcolax supp. Stool in BSC, brown, soft appearance. No red/blood noted. RN to guaiac. HP improving.  Discussed blood transfusion w/ pt and daughter via phone. Both are agreeable to proceed.    OBJECTIVE Vitals:   02/24/18 1648 02/24/18 2030 02/25/18 0013 02/25/18 0739  BP: 118/64 (!) 116/48 (!) 105/41 (!) 105/44  Pulse: 76 80 96 94  Resp: 18 18 18 18   Temp: 98.3 F (36.8 C) 98.5 F (36.9 C) 98.3 F (36.8 C) 98.8 F (37.1 C)  TempSrc: Oral Oral Oral Oral  SpO2: 97% 98% 95% 96%  Weight:      Height:        CBC:  Recent Labs  Lab 02/22/18 2034  02/24/18 0320 02/25/18 0520  WBC 10.0   < > 9.2 7.8  NEUTROABS 5.9  --   --   --   HGB 7.0*   < > 7.6* 6.7*  HCT 22.3*   < > 24.6* 21.5*  MCV 80.8   < > 81.5 83.0  PLT 288   < > 286 230   < > = values in this interval not displayed.    Basic Metabolic Panel:  Recent Labs  Lab 02/24/18 0320 02/25/18 0520  NA 133* 135  K 4.1 3.7  CL 99* 104  CO2 22 23  GLUCOSE 104* 89  BUN 20 15  CREATININE 0.96 0.85  CALCIUM 8.4* 8.1*    Lipid Panel:     Component Value Date/Time   CHOL 138 02/23/2018 0334   TRIG 33 02/23/2018 0334   HDL 53 02/23/2018 0334   CHOLHDL 2.6 02/23/2018 0334   VLDL 7 02/23/2018 0334   LDLCALC 78 02/23/2018 0334   HgbA1c:  Lab Results  Component Value Date   HGBA1C 5.2 02/23/2018    IMAGING  Ct Head Code Stroke Wo Contrast 02/22/2018 1. No acute intracranial infarct or other process identified. 2. ASPECTS is 10. 3. Generalized age-related cerebral atrophy with mild to moderate chronic small vessel ischemic disease.   Ct Head Wo Contrast 02/23/2018 Evolving nonhemorrhagic right basal ganglia infarct.   Dg Chest Port 1 View 02/23/2018 1. Cardiomegaly with vascular congestion and mild diffuse interstitial opacity  consistent with edema 2. Small pleural effusions with bibasilar atelectasis or pneumonia.   Carotid Doppler   There is 1-39% bilateral ICA stenosis. Vertebral artery flow is antegrade.    TTE  - Left ventricle: The cavity size was normal. Wall thickness was normal. Systolic function was normal. The estimated ejection fraction was in the range of 60% to 65%. Wall motion was normal; there were no regional wall motion abnormalities. Indeterminant diastolic function (atrial fibrillation). - Ventricular septum: D-shaped interventricular septum suggestive of RV pressure/volume overload. - Aortic valve: There was trivial regurgitation. - Mitral valve: Moderately calcified annulus. There was mild regurgitation. - Left atrium: The atrium was severely dilated. - Right ventricle: The cavity size was moderately dilated. Systolic function was moderately reduced. - Right atrium: The atrium was severely dilated. - Tricuspid valve: There was severe regurgitation. There was systolic flow reversal in the hepatic vein doppler pattern. Peak RV-RA gradient (S): 92 mm Hg. - Pulmonary arteries: PA peak pressure: 107 mm Hg (S). - Systemic veins: IVC measured 2.2 cm with < 50% respirophasic variation, suggesting RA pressure 15 mmHg. - Pericardium, extracardiac: Pleural effusion noted. Impressions:  The patient appeared to be in atrial fibrillation. Normal LV size with EF 60-65%. Moderately dilated RV with moderately decreased systolic function. Severe biatrial enlargement. Severe tricuspid regurgitation. Severe pulmonary hypertension. Mild mitral regurgitation.   PHYSICAL EXAM General - elderly thin but well developed, in no apparent distress, up in the bed. BUE dark bruising on arms and legs, no active bleeding noted. Ophthalmologic - did not assess Cardiovascular - Irregular rate and rhythm, no tachycardia. Lung - clear bilaterally. Mental Status - Alert and oriented to person, place and year, situation. Follows  complex commands. Speech fluent, able to name and repeat.  Extremities without edema, pulses 2+  Cranial Nerves II - XII - II - no field cut, PERRL III, IV, VI - extraocular movements intact. V - Facial sensation intact bilaterally. VII - facial droop improved, improving mild dysarthria. VIII - Mildly HOH (hearing aids) XI - SCMs intact XII - tongue midline  Motor Strength -  No UE or LE drift. BUE grip strong. LUE 5/5, RUE 5/5. R arm orbits L. LLE 4+/5, RLE 5/5. Muscle bulk decreased Reflexes - Did not assess Sensory - intact, symmetric bilaterally Coordination - no ataxia  Gait and Station - not tested.  No noted bleeding   ASSESSMENT/PLAN Ms. Lindaann Gradilla is a 84 y.o. female with history of AF not on AC, CHF, COPD and PN presenting with L dysphagia, L HP, L facial, L neglect. She received IV t-PA 02/22/2018 at 1758. She was not considered for endovascular intervention given age and risk.  Stroke:  R basal ganglia infarct embolic secondary to known atrial fibrillation not on AC, s/p IV tPA   Resultant  Mild L hp and L facial drooop   Code Stroke CT head no acute abnormality, ASPECTS 10, Small vessel disease. Atrophy.   MRI head canceled d/t back pain yesterday. Pt agreeable to try today. Dr. Roda Shutters discussed with daughter.  MRA head canceled d/t back pain yesterday. Pt agreeable to try today. Dr. Roda Shutters discussed with daughter.  Repeat CT shows R BG infarct  Carotid Doppler  B ICA 1-39% stenosis, VAs antegrade   2D Echo  EF 60-65%. No source of embolus. Decreased systolic fxn, TR, MR. Severe biatrial enlargement, pulm HTN.  LDL 78  HgbA1c 5.2  SCDs for VTE prophylaxis Fall precautions  Diet regular Room service appropriate? Yes; Fluid consistency: Thin - passed SLP swallow assessment for regular diet.  aspirin 81 mg daily prior to admission, started on aspirin 81 mg daily as hgb stabilized with plans for eliquis today, however, Hgb now lower and meds d/c'd(see AF &  anemia discussion below)  Ongoing aggressive stroke risk factor management  Therapy recommendations:  SNF by OT  Consulted SW for ST SNF placement for rehab  Disposition:  pending (from [facility] in [place] w/ some hired assistance with transport to meals & bath max 1-2h/d)  DNR as PTA  Atrial Fibrillation  Home anticoagulation:  No antithrombotic. Had been on warfarin in the past but was d/c'd d/t fall risk 5 years ago. Patient reports she did not fall. dtr reports 2 recent falls but not severe   On metoprolol 50 daily  CHA2DS2-VASc Score = 6, (?2 oral anticoagulation recommended)  Age in Years:  ?74   +2    Sex:  Female   +1    Hypertension History:  0     Diabetes Mellitus:  0  Congestive Heart Failure History:  +1  Vascular Disease History:  0  Stroke/TIA/Thromboembolism History:  yes   +2  Home metoprolol resumed  D/c aspirin 81 mg and planned eliquis   Hyperlipidemia  Home meds:  No statin  LDL 78, goal < 70  lipitor 10 mg daily  Continue statin at discharge  Fe Deficiency Anemia  Hgb 8.2 on admission ->7.0.>7.6->6.7. Hgb 12 in Sept 2018 per Care Everywhere.   No hx anemia per pt/dtr  B arms dark ecchymosis, no active bleeding noted  Fer 10, barely low  Fe 12. low  Normal:  folate, haptoglobin, LDH, Vit B12, TIBC  Fe added 325 tid  On colace at home bid, resumed IP  Daughter stated that pt not eating well at home  Nutrition on board  guaiac stools - visual inspection soft brown, not dark. guaiac pending   Will T&CM, transfuse 2 units w/ lasix during and after  Hematology consulted - Dr. Cain SieveSeng to see  Other Stroke Risk Factors  Advanced age  UDS / ETOH level not performed   Hx CHF. Lasix on hold during admission. Lungs cl w/o LE edema. Will given during blood transfusion  Other Active Problems  PN on  neurontin  COPD  Renal insufficiency, resolved 34/1.4->20/0.96  Hx back pain - fell in Dec. Pt refused  workup, imaging, therapy. Dtr and pt do not desire extreme heroic measures. They desire a comfortable, pain free life.  Hospital day # 3  Rhoderick MoodySharon Biby  [facility] See Amion for Pager information 02/25/2018 8:10 AM   ATTENDING NOTE: I reviewed above note and agree with the assessment and plan. I have made any additions or clarifications directly to the above note. Pt was seen and examined.   84 year old female with history of atrial fibrillation off Coumadin for 5 years due to fall risk, COPD, CHF admitted for acute onset slurred speech, left-sided weakness, left facial droop and left neglect.  CT negative for bleeding.  Status post TPA.  CUS unremarkable. EF 60-65% with severely dilated LA. MRI and MRA not able to perform due to back pain not able to lie flat.  A1c 5.2 LDL 78.  Found to have severe anemia Hb 7.0->7.5. Put on ASA 81mg . However, today Hb 6.7. Will get PRBC 2 units. Anemia work up showed iron deficiency anemia, on iron pills. However, stool occult blood positive. Will have GI consult.   Patient stroke most likely due to A. fib not on anticoagulation.  Patient was taken off Coumadin 5 years ago likely due to fall risk. However, pt reported seldom falls (one recently only). It is reasonable to consider Eliquis 2.5 twice daily for stroke prevention once anemia improved.    OT recommend SNF. Still pending PT evaluation. Code status DNR.   Marvel PlanJindong Bryttney Netzer, MD PhD Stroke Neurology 02/25/2018 8:10 AM  I spent  35 minutes in total face-to-face time with the patient, more than 50% of which was spent in counseling and coordination of care, reviewing test results, images and medication, and discussing the diagnosis of stroke and severe anemia, treatment plan and potential prognosis. This patient's care requiresreview of multiple databases, neurological assessment, discussion with family, other specialists and medical decision making of high complexity.   To contact Stroke  Continuity provider, please refer to [email or web address]. After hours, contact General Neurology

## 2018-02-26 DIAGNOSIS — D509 Iron deficiency anemia, unspecified: Secondary | ICD-10-CM | POA: Diagnosis present

## 2018-02-26 DIAGNOSIS — I639 Cerebral infarction, unspecified: Secondary | ICD-10-CM

## 2018-02-26 DIAGNOSIS — I4891 Unspecified atrial fibrillation: Secondary | ICD-10-CM | POA: Diagnosis present

## 2018-02-26 DIAGNOSIS — E785 Hyperlipidemia, unspecified: Secondary | ICD-10-CM | POA: Diagnosis present

## 2018-02-26 DIAGNOSIS — G629 Polyneuropathy, unspecified: Secondary | ICD-10-CM

## 2018-02-26 LAB — TYPE AND SCREEN
ABO/RH(D): O POS
Antibody Screen: NEGATIVE
UNIT DIVISION: 0
UNIT DIVISION: 0

## 2018-02-26 LAB — BPAM RBC
BLOOD PRODUCT EXPIRATION DATE: 201904242359
Blood Product Expiration Date: 201904042359
ISSUE DATE / TIME: 201903281644
ISSUE DATE / TIME: 201903281244
UNIT TYPE AND RH: 5100
Unit Type and Rh: 5100

## 2018-02-26 LAB — BASIC METABOLIC PANEL
Anion gap: 10 (ref 5–15)
BUN: 15 mg/dL (ref 6–20)
CALCIUM: 8.2 mg/dL — AB (ref 8.9–10.3)
CO2: 25 mmol/L (ref 22–32)
CREATININE: 0.78 mg/dL (ref 0.44–1.00)
Chloride: 99 mmol/L — ABNORMAL LOW (ref 101–111)
Glucose, Bld: 89 mg/dL (ref 65–99)
Potassium: 3.2 mmol/L — ABNORMAL LOW (ref 3.5–5.1)
SODIUM: 134 mmol/L — AB (ref 135–145)

## 2018-02-26 LAB — CBC
HCT: 32.4 % — ABNORMAL LOW (ref 36.0–46.0)
Hemoglobin: 10.1 g/dL — ABNORMAL LOW (ref 12.0–15.0)
MCH: 26.6 pg (ref 26.0–34.0)
MCHC: 31.2 g/dL (ref 30.0–36.0)
MCV: 85.5 fL (ref 78.0–100.0)
PLATELETS: 234 10*3/uL (ref 150–400)
RBC: 3.79 MIL/uL — AB (ref 3.87–5.11)
RDW: 17.4 % — ABNORMAL HIGH (ref 11.5–15.5)
WBC: 11.4 10*3/uL — AB (ref 4.0–10.5)

## 2018-02-26 MED ORDER — PANTOPRAZOLE SODIUM 40 MG PO TBEC
40.0000 mg | DELAYED_RELEASE_TABLET | Freq: Two times a day (BID) | ORAL | Status: DC
  Administered 2018-02-26 – 2018-02-27 (×3): 40 mg via ORAL
  Filled 2018-02-26 (×3): qty 1

## 2018-02-26 NOTE — Progress Notes (Signed)
CSW following for discharge plan. CSW worked throughout the day to locate a placement for the patient that was close for the patient's family to access (patient's daughter is [place]on Gully[place]CPOA and is 84 years old, has limited ability to travel). Patient has a bed [facility] for tomorrow.  CSW will follow.  Blenda NicelyElizabeth Kriss Ishler, [place] Clinical Social Worker [phone]

## 2018-02-26 NOTE — Progress Notes (Addendum)
STROKE TEAM PROGRESS NOTE   SUBJECTIVE (INTERVAL HISTORY) HGB up after transfusion. Patient has elected not to pursue further workup. Medical stable for d/c once bed found.   OBJECTIVE Vitals:   02/26/18 0400 02/26/18 0911 02/26/18 0924 02/26/18 1248  BP: (!) 86/53  (!) 99/45 (!) 113/55  Pulse: 92  97 96  Resp: 18  20 20   Temp: 97.7 F (36.5 C)  98.2 F (36.8 C) 98.7 F (37.1 C)  TempSrc: Oral  Oral Oral  SpO2: 94% 91% 99% 94%  Weight:      Height:        CBC:  Recent Labs  Lab 02/22/18 2034  02/25/18 0520 02/25/18 2307 02/26/18 0706  WBC 10.0   < > 7.8  --  11.4*  NEUTROABS 5.9  --   --   --   --   HGB 7.0*   < > 6.7* 10.5* 10.1*  HCT 22.3*   < > 21.5*  --  32.4*  MCV 80.8   < > 83.0  --  85.5  PLT 288   < > 230  --  234   < > = values in this interval not displayed.    Basic Metabolic Panel:  Recent Labs  Lab 02/25/18 0520 02/26/18 0706  NA 135 134*  K 3.7 3.2*  CL 104 99*  CO2 23 25  GLUCOSE 89 89  BUN 15 15  CREATININE 0.85 0.78  CALCIUM 8.1* 8.2*    Lipid Panel:     Component Value Date/Time   CHOL 138 02/23/2018 0334   TRIG 33 02/23/2018 0334   HDL 53 02/23/2018 0334   CHOLHDL 2.6 02/23/2018 0334   VLDL 7 02/23/2018 0334   LDLCALC 78 02/23/2018 0334   HgbA1c:  Lab Results  Component Value Date   HGBA1C 5.2 02/23/2018    IMAGING  Ct Head Code Stroke Wo Contrast 02/22/2018 1. No acute intracranial infarct or other process identified. 2. ASPECTS is 10. 3. Generalized age-related cerebral atrophy with mild to moderate chronic small vessel ischemic disease.   Ct Head Wo Contrast 02/23/2018 Evolving nonhemorrhagic right basal ganglia infarct.   Dg Chest Port 1 View 02/23/2018 1. Cardiomegaly with vascular congestion and mild diffuse interstitial opacity consistent with edema 2. Small pleural effusions with bibasilar atelectasis or pneumonia.   Carotid Doppler   There is 1-39% bilateral ICA stenosis. Vertebral artery flow is antegrade.     TTE  - Left ventricle: The cavity size was normal. Wall thickness was normal. Systolic function was normal. The estimated ejection fraction was in the range of 60% to 65%. Wall motion was normal; there were no regional wall motion abnormalities. Indeterminant diastolic function (atrial fibrillation). - Ventricular septum: D-shaped interventricular septum suggestive of RV pressure/volume overload. - Aortic valve: There was trivial regurgitation. - Mitral valve: Moderately calcified annulus. There was mild regurgitation. - Left atrium: The atrium was severely dilated. - Right ventricle: The cavity size was moderately dilated. Systolic function was moderately reduced. - Right atrium: The atrium was severely dilated. - Tricuspid valve: There was severe regurgitation. There was systolic flow reversal in the hepatic vein doppler pattern. Peak RV-RA gradient (S): 92 mm Hg. - Pulmonary arteries: PA peak pressure: 107 mm Hg (S). - Systemic veins: IVC measured 2.2 cm with < 50% respirophasic variation, suggesting RA pressure 15 mmHg. - Pericardium, extracardiac: Pleural effusion noted. Impressions:  The patient appeared to be in atrial fibrillation. Normal LV size with EF 60-65%. Moderately dilated RV with moderately  decreased systolic function. Severe biatrial enlargement. Severe tricuspid regurgitation. Severe pulmonary hypertension. Mild mitral regurgitation.   PHYSICAL EXAM General - elderly thin but well developed, in no apparent distress, up in the bed. BUE dark bruising on arms and legs, no active bleeding noted. Ophthalmologic - did not assess Cardiovascular - Irregular rate and rhythm, no tachycardia. Lung - clear bilaterally. Mental Status - Alert and oriented to person, place and year, situation. Follows complex commands. Speech fluent, able to name and repeat.  Extremities without edema, pulses 2+  Cranial Nerves II - XII - II - no field cut, PERRL III, IV, VI - extraocular movements  intact. V - Facial sensation intact bilaterally. VII - facial droop improved, improving mild dysarthria. VIII - Mildly HOH (hearing aids) XI - SCMs intact XII - tongue midline  Motor Strength -  No UE or LE drift. BUE grip strong. LUE 5/5, RUE 5/5. R arm orbits L. LLE 4+/5, RLE 5/5. Muscle bulk decreased Reflexes - Did not assess Sensory - intact, symmetric bilaterally Coordination - no ataxia  Gait and Station - not tested.  No noted bleeding  No change in exam since yesterday.   ASSESSMENT/PLAN [name] is a 84 y.o. female with history of AF not on AC, CHF, COPD and PN presenting with L dysphagia, L HP, L facial, L neglect. She received IV t-PA 02/22/2018 at 1758. She was not considered for endovascular intervention given age and risk.  Stroke:  R basal ganglia infarct embolic secondary to known atrial fibrillation not on AC, s/p IV tPA   Resultant  Mild L hp and L facial drooop   Code Stroke CT head no acute abnormality, ASPECTS 10, Small vessel disease. Atrophy.   MRI head canceled d/t back pain yesterday. Pt agreeable to try today. Dr. Roda Shutters discussed with daughter.  MRA head canceled d/t back pain yesterday. Pt agreeable to try today. Dr. Roda Shutters discussed with daughter.  Repeat CT shows R BG infarct  Carotid Doppler  B ICA 1-39% stenosis, VAs antegrade   2D Echo  EF 60-65%. No source of embolus. Decreased systolic fxn, TR, MR. Severe biatrial enlargement, pulm HTN.  LDL 78  HgbA1c 5.2  SCDs for VTE prophylaxis Fall precautions  Diet regular Room service appropriate? Yes; Fluid consistency: Thin - passed SLP swallow assessment for regular diet.  aspirin 81 mg daily prior to admission, started on aspirin 81 mg daily as hgb stabilized with plans for eliquis today, however, Hgb now lower and meds d/c'd(see AF & anemia discussion below)  Ongoing aggressive stroke risk factor management  Therapy recommendations:  SNF   Disposition:  SNF (from [facility] in [place] w/ some hired assistance with transport to meals & bath max 1-2h/d. Looking for SNF in [facility])  Medical stable for d/c once bed found.  DNR as PTA  Atrial Fibrillation  Home anticoagulation:  No antithrombotic. Had been on warfarin in the past but was d/c'd d/t fall risk 5 years ago. Patient reports she did not fall. dtr reports 2 recent falls but not severe   On metoprolol 50 daily  CHA2DS2-VASc Score = 6, (?2 oral anticoagulation recommended)  Age in Years:  ?57   +2    Sex:  Female   +1    Hypertension History:  0     Diabetes Mellitus:  0  Congestive Heart Failure History:  +1  Vascular Disease History:  0     Stroke/TIA/Thromboembolism History:  yes   +2  Home metoprolol resumed  D/c aspirin 81 mg and planned eliquis given low HGB   Hyperlipidemia  Home meds:  No statin  LDL 78, goal < 70  lipitor 10 mg daily  Continue statin at discharge  Fe Deficiency Anemia  Hgb 8.2 on admission ->7.0.>7.6->6.7. Hgb 12 in Sept 2018 per Care Everywhere.   No hx anemia per pt/dtr  B arms dark ecchymosis, no active bleeding noted  Fer 10, barely low  Fe 12. low  Normal:  folate, haptoglobin, LDH, Vit B12, TIBC  Fe added 325 tid  On colace at home bid, resumed IP  Daughter stated that pt not eating well at home  Nutrition on board  guaiac stools - visual inspection soft brown, not dark. guaiac positive  transfused 2 units PRBC w/ lasix during and after 02/25/2018. Pt tolerated well. Hgb post transfusion 10.1 this am  GI consulted - likely slow GI bleed. Supportive care w/ PPI, serial CBCs and transfusions as needed  Other Stroke Risk Factors  Advanced age  UDS / ETOH level not performed   Hx CHF. Lasix on hold during admission. Lungs cl w/o LE edema. Will given during blood transfusion  Other Active Problems  PN on  neurontin  COPD  Renal insufficiency, resolved 34/1.4->20/0.96  Hx back pain - fell in Dec. Pt refused workup,  imaging, therapy. Dtr and pt do not desire extreme heroic measures. They desire a comfortable, pain free life.  Hospital day # 4  Rhoderick MoodySharon Cook  [facility] See Amion for Pager information 02/26/2018 2:35 PM   ATTENDING NOTE: I reviewed above note and agree with the assessment and plan. I have made any additions or clarifications directly to the above note. Pt was seen and examined.  84 year old female with history of atrial fibrillation off Coumadin for 5 years due to fall risk, COPD, CHF admitted for acute onset slurred speech, left-sided weakness, left facial droop and left neglect. CT negative for bleeding. Status post TPA. CUS unremarkable. EF 60-65% with severely dilated LA. MRI and MRA not able to perform due to back pain not able to lie flat. A1c 5.2 LDL 78.  Found to have severe anemia Hb 7.0->7.5. Put on ASA 81mg . Hb drop to 6.7 the second day. Got PRBC 2 units and Hb up to 10.5->10.1. Anemia work up showed iron deficiency anemia, on iron pills. Stool occult blood positive. GI consulted and pt refused colonoscopy or surgery. GI recommended PPI and series CBC monitoring.   Patient stroke most likely due to A. fib not on anticoagulation. Patient was taken off Coumadin 5 years ago. However due to severe anemia requiring blood transfusion, and no further work up or cure for GI cause, not candidate for antithrombotic at this time. Will need frequent CBC monitoring at SNF and transfusion if needed. Continue iron pills. PT/OT recommend SNF. Pending SNF tomorrow.   Marvel PlanJindong Aleczander Fandino, MD PhD Stroke Neurology 02/26/2018 5:07 PM   To contact Stroke Continuity provider, please refer to [email or web address]. After hours, contact General Neurology

## 2018-02-26 NOTE — Plan of Care (Signed)
  Problem: Education: Goal: Knowledge of disease or condition will improve Outcome: Progressing Goal: Knowledge of secondary prevention will improve Outcome: Progressing Goal: Knowledge of patient specific risk factors addressed and post discharge goals established will improve Outcome: Progressing   

## 2018-02-26 NOTE — Progress Notes (Signed)
PT Cancellation Note  Patient Details Name: Krista Cook MRN: [id number] DOB: 05/18/1915   Cancelled Treatment:    Reason Eval/Treat Not Completed: Fatigue/lethargy limiting ability to participate. Pt reports she is too fatigued to participate at this time despite max encouragement. Discussed pt case and discharge plan with OT. Pt from independent living, not assisted living as previously reported during evaluation. Pt will not have the supervision/assistance she will need at d/c to allow for safe return to her independent living apartment. Feel she will require 24 hour supervision and continued rehab at the SNF level immediately following acute stay to maximize functional independence and safety with mobility. Will update d/c recommendations and PT frequency appropriately to reflect disposition change to SNF. Will continue to follow and progress as able per POC.   Conni SlipperLaura Newel Oien, PT, DPT Acute Rehabilitation Services Pager: [phone]    Marylynn PearsonLaura D Anwen Cannedy 02/26/2018, 1:43 PM

## 2018-02-26 NOTE — Discharge Summary (Signed)
Stroke Discharge Summary  Patient ID: Krista Cook   MRN: [id number]      DOB: [birth year]  Date of Admission: 02/22/2018 Date of Discharge: 02/27/2018  Attending Physician:  Marvel Plan, MD, Stroke MD Consultant(s):   Treatment Team:  Willis Modena, MD  (GI) Patient's PCP:  Loyal Jacobson, MD  DISCHARGE DIAGNOSIS:  Principal Problem:   Right sided cerebral infarction (HCC) R Basal ganglia s/p IV tPA Active Problems:   Atrial fibrillation (HCC)   Hyperlipidemia LDL goal <70   Fe deficiency anemia d/t slow GI bleed   Peripheral neuropathy   History reviewed. No pertinent past medical history. History reviewed. No pertinent surgical history.  Allergies as of 02/27/2018      Reactions   Celebrex [celecoxib]    Ultram [tramadol]       Medication List    STOP taking these medications   albuterol 108 (90 Base) MCG/ACT inhaler Commonly known as:  PROVENTIL HFA;VENTOLIN HFA Replaced by:  albuterol (2.5 MG/3ML) 0.083% nebulizer solution   aspirin 81 MG EC tablet   furosemide 20 MG tablet Commonly known as:  LASIX   Metoprolol Succinate 50 MG Cs24 Replaced by:  metoprolol succinate 25 MG 24 hr tablet     TAKE these medications   albuterol (2.5 MG/3ML) 0.083% nebulizer solution Commonly known as:  PROVENTIL Inhale 3 mLs into the lungs every 4 (four) hours as needed for wheezing or shortness of breath. Replaces:  albuterol 108 (90 Base) MCG/ACT inhaler   atorvastatin 10 MG tablet Commonly known as:  LIPITOR Take 1 tablet (10 mg total) by mouth daily at 6 PM.   docusate sodium 100 MG capsule Commonly known as:  COLACE Take 1 capsule (100 mg total) by mouth 2 (two) times daily.   ferrous sulfate 325 (65 FE) MG tablet Take 1 tablet (325 mg total) by mouth 3 (three) times daily with meals.   gabapentin 100 MG capsule Commonly known as:  NEURONTIN Take 500 mg by mouth at bedtime.   metoprolol succinate 25 MG 24 hr tablet Commonly known as:  TOPROL-XL Take 1  tablet (25 mg total) by mouth daily. Start taking on:  02/28/2018 Replaces:  Metoprolol Succinate 50 MG Cs24   mometasone-formoterol 100-5 MCG/ACT Aero Commonly known as:  DULERA Inhale 1 puff into the lungs 2 (two) times daily. What changed:  when to take this   moxifloxacin 0.5 % ophthalmic solution Commonly known as:  VIGAMOX Place 1 drop into both eyes 3 (three) times daily.   omeprazole 40 MG capsule Commonly known as:  PRILOSEC Take 40 mg by mouth daily.   potassium chloride SA 20 MEQ tablet Commonly known as:  K-DUR,KLOR-CON Take 1 tablet (20 mEq total) by mouth 2 (two) times daily. Four doses only then recheck potassium level   travoprost (benzalkonium) 0.004 % ophthalmic solution Commonly known as:  TRAVATAN Place 1 drop into both eyes at bedtime.       LABORATORY STUDIES CBC    Component Value Date/Time   WBC 11.0 (H) 02/27/2018 0618   RBC 3.82 (L) 02/27/2018 0618   HGB 10.3 (L) 02/27/2018 0618   HCT 32.9 (L) 02/27/2018 0618   PLT 223 02/27/2018 0618   MCV 86.1 02/27/2018 0618   MCH 27.0 02/27/2018 0618   MCHC 31.3 02/27/2018 0618   RDW 18.1 (H) 02/27/2018 0618   LYMPHSABS 3.6 02/22/2018 2034   MONOABS 0.4 02/22/2018 2034   EOSABS 0.0 02/22/2018 2034   BASOSABS 0.0 02/22/2018 2034  CMP    Component Value Date/Time   NA 133 (L) 02/27/2018 0618   K 3.1 (L) 02/27/2018 0618   CL 98 (L) 02/27/2018 0618   CO2 26 02/27/2018 0618   GLUCOSE 91 02/27/2018 0618   BUN 12 02/27/2018 0618   CREATININE 0.67 02/27/2018 0618   CALCIUM 8.2 (L) 02/27/2018 0618   PROT 5.7 (L) 02/22/2018 1739   ALBUMIN 3.0 (L) 02/22/2018 1739   AST 19 02/22/2018 1739   ALT 8 (L) 02/22/2018 1739   ALKPHOS 76 02/22/2018 1739   BILITOT 0.7 02/22/2018 1739   GFRNONAA >60 02/27/2018 0618   GFRAA >60 02/27/2018 0618   COAGS Lab Results  Component Value Date   INR 1.13 02/22/2018   Lipid Panel    Component Value Date/Time   CHOL 138 02/23/2018 0334   TRIG 33 02/23/2018 0334    HDL 53 02/23/2018 0334   CHOLHDL 2.6 02/23/2018 0334   VLDL 7 02/23/2018 0334   LDLCALC 78 02/23/2018 0334   HgbA1C  Lab Results  Component Value Date   HGBA1C 5.2 02/23/2018    SIGNIFICANT DIAGNOSTIC STUDIES Ct Head Code Stroke Wo Contrast 02/22/2018 1. No acute intracranial infarct or other process identified. 2. ASPECTS is 10. 3. Generalized age-related cerebral atrophy with mild to moderate chronic small vessel ischemic disease.   Ct Head Wo Contrast 02/23/2018 Evolving nonhemorrhagic right basal ganglia infarct.   Dg Chest Port 1 View 02/23/2018 1. Cardiomegaly with vascular congestion and mild diffuse interstitial opacity consistent with edema 2. Small pleural effusions with bibasilar atelectasis or pneumonia.   Carotid Doppler   There is 1-39% bilateral ICA stenosis. Vertebral artery flow is antegrade.    2D Echocardiogram  - Left ventricle: The cavity size was normal. Wall thickness wasnormal. Systolic function was normal. The estimated ejectionfraction was in the range of 60% to 65%. Wall motion was normal;there were no regional wall motion abnormalities. Indeterminantdiastolic function (atrial fibrillation). - Ventricular septum: D-shaped interventricular septum suggestiveof RV pressure/volume overload. - Aortic valve: There was trivial regurgitation. - Mitral valve: Moderately calcified annulus. There was mildregurgitation. - Left atrium: The atrium was severely dilated. - Right ventricle: The cavity size was moderately dilated. Systolicfunction was moderately reduced. - Right atrium: The atrium was severely dilated. - Tricuspid valve: There was severe regurgitation. There wassystolic flow reversal in the hepatic vein doppler pattern. PeakRV-RA gradient (S): 92 mm Hg. - Pulmonary arteries: PA peak pressure: 107 mm Hg (S). - Systemic veins: IVC measured 2.2 cm with < 50% respirophasicvariation, suggesting RA pressure 15 mmHg. - Pericardium, extracardiac:  Pleural effusion noted. Impressions:  The patient appeared to be in atrial fibrillation. Normal LV sizewith EF 60-65%. Moderately dilated RV with moderately decreasedsystolic function. Severe biatrial enlargement. Severe tricuspidregurgitation. Severe pulmonary hypertension. Mild mitralregurgitation.     HISTORY OF PRESENT ILLNESS Krista Cook is an [age] y.o. female a past medical history of atrial fibrillation not on anticoagulation, COPD,CHF peripheral neuropathy presents as a stroke alert.  Patient lives in an independent living facility and per her home caretaker last seen normal was 4:30 PM on 02/22/2018. She was then noted to have sudden onset dysarthria and left-sided weakness. EMS was called and [facility] room. Blood pressure was 130 systolic on arrival. She was noted to be neglecting the left side, in addition to having left facial droop, dysarthria and some weakness. CT head showed no acute findings and aspects 10. NIHSS was 13. She received iv TPA after speaking with daughter.  After having prolonged discussion with daughter, it was decided that we would not pursue mechanical thrombectomy even if she had a large vessel occlusion has a daughter who was POA felt this would too invasive. Modified Rankin: Rankin Score=1. She was admitted to the ICU post tPA.    HOSPITAL COURSE Krista Cook is a 84 y.o. female with history of AF not on AC, CHF, COPD and PN presenting with L dysphagia, L HP, L facial, L neglect. She received IV t-PA 02/22/2018 at 1758. She was not considered for endovascular intervention given age and risk.  Stroke:  R basal ganglia infarct embolic secondary to known atrial fibrillation not on AC, s/p IV tPA   Resultant  Mild L hp and L facial drooop   Code Stroke CT head no acute abnormality, ASPECTS 10, Small vessel disease. Atrophy.   MRI head canceled d/t back pain yesterday. Pt agreeable to try today. Dr. Roda Shutters discussed with  daughter.  MRA head canceled d/t back pain yesterday. Pt agreeable to try today. Dr. Roda Shutters discussed with daughter.  Repeat CT shows R BG infarct  Carotid Doppler  B ICA 1-39% stenosis, VAs antegrade   2D Echo  EF 60-65%. No source of embolus. Decreased systolic fxn, TR, MR. Severe biatrial enlargement, pulm HTN.  LDL 78  HgbA1c 5.2  SCDs for VTE prophylaxis  Fall precautions  Diet regular Room service appropriate? Yes; Fluid consistency: Thin - passed SLP swallow assessment for regular diet.  aspirin 81 mg daily prior to admission, started on aspirin 81 mg daily as hgb stabilized with plans for eliquis today, however, Hgb now lower and meds d/c'd(see AF & anemia discussion below)  Ongoing aggressive stroke risk factor management  Therapy recommendations:  SNF   Disposition:  SNF (from [facility] in [place] w/ some hired assistance with transport to meals & bath max 1-2h/d. Looking for SNF in [facility])  Medical stable for d/c once bed found.  DNR as PTA  Atrial Fibrillation  Home anticoagulation:  No antithrombotic. Had been on warfarin in the past but was d/c'd d/t fall risk 5 years ago. Patient reports she did not fall. dtr reports 2 recent falls but not severe   On metoprolol 50 daily  CHA2DS2-VASc Score = 6, (?2 oral anticoagulation recommended)             Age in Years:  ?67   +2                        Sex:  Female   +1                     Hypertension History:  0                       Diabetes Mellitus:  0              Congestive Heart Failure History:  +1             Vascular Disease History:  0                           Stroke/TIA/Thromboembolism History:  yes   +2  Home metoprolol resumed  D/c aspirin 81 mg and planned eliquis given low HGB   Hyperlipidemia  Home meds:  No statin  LDL 78, goal < 70  lipitor 10 mg daily  Continue statin at  discharge  Fe Deficiency Anemia  Hgb 8.2 on admission ->7.0.>7.6->6.7. Hgb 12 in Sept  2018 per Care Everywhere.   No hx anemia per pt/dtr  B arms dark ecchymosis, no active bleeding noted  Fer 10, barely low  Fe 12. low  Normal:  folate, haptoglobin, LDH, Vit B12, TIBC  Fe added 325 tid  On colace at home bid, resumed IP  Daughter stated that pt not eating well at home  Nutrition on board  guaiac stools - visual inspection soft brown, not dark. guaiac positive  transfused 2 units PRBC w/ lasix during and after 02/25/2018. Pt tolerated well. Hgb post transfusion 10.1 this am  GI consulted - likely slow GI bleed. Supportive care w/ PPI, serial CBCs and transfusions as needed  Other Stroke Risk Factors  Advanced age  UDS / ETOH level not performed   Hx CHF. Lasix on hold. Lungs cl w/o LE edema. Will given during blood transfusion  Other Active Problems  PN on  neurontin  COPD  Renal insufficiency, resolved 34/1.4->20/0.96  Hx back pain - fell in Dec. Pt refused workup, imaging, therapy. Dtr and pt do not desire extreme heroic measures. They desire a comfortable, pain free life.  Hypokalemia - supplement and recheck     DISCHARGE EXAM Blood pressure (!) 109/56, pulse 91, temperature 98.7 F (37.1 C), temperature source Oral, resp. rate 18, height 5\' 2"  (1.575 m), weight 114 lb (51.7 kg), SpO2 97 %. General - elderly thin but well developed, in no apparent distress, up in the bed. BUE dark bruising on arms and legs, no active bleeding noted. Ophthalmologic - did not assess Cardiovascular - Irregular rate and rhythm, no tachycardia. Lung - clear bilaterally. Mental Status - Alert and oriented to person, place and year, situation. Follows complex commands. Speech fluent, able to name and repeat.  Extremities without edema, pulses 2+  Cranial Nerves II - XII - II - no field cut, PERRL III, IV, VI - extraocular movements intact. V - Facial sensation intact bilaterally. VII - facial droop improved, improving mild dysarthria. VIII - Mildly HOH  (hearing aids) XI - SCMs intact XII - tongue midline  Motor Strength -  No UE or LE drift. BUE grip strong. LUE 5/5, RUE 5/5. R arm orbits L. LLE 4+/5, RLE 5/5. Muscle bulk decreased Reflexes - Did not assess Sensory - intact, symmetric bilaterally Coordination - no ataxia  Gait and Station - not tested.  No noted bleeding   Discharge Diet   Fall precautions Diet regular Room service appropriate? Yes; Fluid consistency: Thin liquids  DISCHARGE PLAN  Disposition:  Discharge to skilled nursing facility for ongoing PT, OT and ST.   No antithrombotics for secondary stroke prevention d/t slow GIB  Continue PPI. Check serial CBCs - Monday and Thursdays for 3 weeks.   Transfuse as neeeded  Supplement potassium and recheck level next Thursday  Ongoing risk factor control by Primary Care Physician at time of discharge  Follow-up Loyal Jacobson, MD in 2 weeks.  Follow-up with Dr. Marvel Plan , Stroke Clinic in 8 weeks, office to schedule an appointment.  40 minutes were spent preparing discharge.  Delton See PA-C [facility] Pager [phone] 02/27/2018, 1:11 PM  See Amion for Schedule & Pager information 02/27/2018 1:11 PM

## 2018-02-26 NOTE — Care Management Important Message (Signed)
Important Message  Patient Details  Name: Krista Cook MRN: [id number] Date of Birth: 06/20/1915   Medicare Important Message Given:  Yes    Aysia Lowder 02/26/2018, 1:11 PM

## 2018-02-26 NOTE — Care Management Note (Signed)
Case Management Note  Patient Details  Name: Krista Cook MRN: [id number] Date of Birth: 05/23/1915  Subjective/Objective:    Pt admitted with CVA and is s/p TPA. She is from [facility] and has aide services but not 24/7.                Action/Plan: PT/OT recommending SNF. CM following for d/c disposition.   Expected Discharge Date:                  Expected Discharge Plan:  Skilled Nursing Facility  In-House Referral:  Clinical Social Work  Discharge planning Services  CM Consult  Post Acute Care Choice:    Choice offered to:     DME Arranged:    DME Agency:     HH Arranged:    HH Agency:     Status of Service:  Completed, signed off  If discussed at [employer] Meetings, dates discussed:    Additional Comments:  Kermit BaloKelli F Rana Adorno, RN 02/26/2018, 2:53 PM

## 2018-02-26 NOTE — Progress Notes (Signed)
Physical Therapy Treatment Patient Details Name: Krista Cook MRN: [id number] DOB: December 05, 1914 Today's Date: 02/26/2018    History of Present Illness 84 yo admitted with left weakness and slurred speech s/p tPA, MRI pending. PMhx: atrial fibrillation not on anticoagulation, COPD,CHF, peripheral neuropathy     PT Comments    Pt is making slow progress towards her goals, however continues to be limited by decreased strength and balance. Pt is currently minA for bed mobility, min guard for transfers and minA for ambulation of 20 feet in the room with minA for 1x LoB and safe navigation around obstacles in the room. PT recommends SNF level rehab at d/c to improve strength and balance before returning to her independent living facility. PT will continue to follow acutely until d/c.    Follow Up Recommendations  SNF     Equipment Recommendations  Other (comment)(to be determined at next venue)       Precautions / Restrictions Precautions Precautions: Fall Restrictions Weight Bearing Restrictions: No    Mobility  Bed Mobility Overal bed mobility: Needs Assistance Bed Mobility: Supine to Sit;Rolling Rolling: Modified independent (Device/Increase time)   Supine to sit: HOB elevated;Min assist     General bed mobility comments: pt soiled on entry and was mod I for rolling L and R for pericare and pad placement, minA for trunk to upright with painful back, vc for hand placement to push to upright, minA for pad scoot to EoB,   Transfers Overall transfer level: Needs assistance   Transfers: Sit to/from Stand Sit to Stand: Min guard         General transfer comment: guarding for safety   Ambulation/Gait Ambulation/Gait assistance: Min assist Ambulation Distance (Feet): 20 Feet Assistive device: Rolling walker (2 wheeled) Gait Pattern/deviations: Step-through pattern;Decreased stride length;Trunk flexed Gait velocity: slowed Gait velocity interpretation: Below normal speed  for age/gender General Gait Details: minA for steadying with RW for 1x LoB, and for navigation of RW around obstacles in room as pt did not respond to vc in time to keep from banging hand into sink with RW    Modified Rankin (Stroke Patients Only) Modified Rankin (Stroke Patients Only) Pre-Morbid Rankin Score: Moderate disability Modified Rankin: Moderate disability     Balance Overall balance assessment: Needs assistance;History of Falls   Sitting balance-Leahy Scale: Good       Standing balance-Leahy Scale: Poor                              Cognition Arousal/Alertness: Awake/alert Behavior During Therapy: WFL for tasks assessed/performed Overall Cognitive Status: Within Functional Limits for tasks assessed                                               Pertinent Vitals/Pain Pain Assessment: Faces Faces Pain Scale: Hurts little more Pain Location: back- with bed mobility Pain Descriptors / Indicators: Sore Pain Intervention(s): Limited activity within patient's tolerance;Monitored during session;Repositioned    Home Living Family/patient expects to be discharged to:: Assisted living             Home Equipment: Walker - 2 wheels;Shower seat - built in      Prior Function Level of Independence: Needs assistance  Gait / Transfers Assistance Needed: rollator for apartment distance, [facility] for longer hallways ADL's / Homemaking Assistance Needed: staff assists  with bath and does all the cooking and cleaning, pt dresses herself and goes to the beauty shop on Thursdays     PT Goals (current goals can now be found in the care plan section) Acute Rehab PT Goals Patient Stated Goal: go home PT Goal Formulation: With patient Time For Goal Achievement: 03/09/18 Potential to Achieve Goals: Good Progress towards PT goals: Progressing toward goals    Frequency    Min 3X/week      PT Plan Current plan remains appropriate       AM-PAC  PT "6 Clicks" Daily Activity  Outcome Measure  Difficulty turning over in bed (including adjusting bedclothes, sheets and blankets)?: A Little Difficulty moving from lying on back to sitting on the side of the bed? : Unable Difficulty sitting down on and standing up from a chair with arms (e.g., wheelchair, bedside commode, etc,.)?: Unable Help needed moving to and from a bed to chair (including a wheelchair)?: A Little Help needed walking in hospital room?: A Little Help needed climbing 3-5 steps with a railing? : A Lot 6 Click Score: 13    End of Session Equipment Utilized During Treatment: Gait belt Activity Tolerance: Patient tolerated treatment well Patient left: in chair;with chair alarm set;with call bell/phone within reach Nurse Communication: Mobility status;Precautions PT Visit Diagnosis: Other abnormalities of gait and mobility (R26.89);Muscle weakness (generalized) (M62.81);Difficulty in walking, not elsewhere classified (R26.2)     Time: [id number] PT Time Calculation (min) (ACUTE ONLY): 21 min  Charges:  $Gait Training: 8-22 mins                    G Codes:       Amayiah Gosnell B. Beverely RisenVan Fleet PT, DPT Acute Rehabilitation  [phone] Pager [phone]     Elon Alaslizabeth B Van Fleet 02/26/2018, 4:08 PM

## 2018-02-26 NOTE — Plan of Care (Signed)
  Problem: Education: Goal: Knowledge of disease or condition will improve Outcome: Progressing Goal: Knowledge of secondary prevention will improve Outcome: Progressing Goal: Knowledge of patient specific risk factors addressed and post discharge goals established will improve Outcome: Progressing   Problem: Health Behavior/Discharge Planning: Goal: Ability to manage health-related needs will improve Outcome: Progressing   Problem: Self-Care: Goal: Ability to participate in self-care as condition permits will improve Outcome: Progressing Goal: Verbalization of feelings and concerns over difficulty with self-care will improve Outcome: Progressing Goal: Ability to communicate needs accurately will improve Outcome: Progressing   Problem: Nutrition: Goal: Risk of aspiration will decrease Outcome: Progressing Goal: Dietary intake will improve Outcome: Progressing   Problem: Ischemic Stroke/TIA Tissue Perfusion: Goal: Complications of ischemic stroke/TIA will be minimized Outcome: Progressing   Problem: Education: Goal: Knowledge of General Education information will improve Outcome: Progressing   Problem: Health Behavior/Discharge Planning: Goal: Ability to manage health-related needs will improve Outcome: Progressing   Problem: Clinical Measurements: Goal: Ability to maintain clinical measurements within normal limits will improve Outcome: Progressing Goal: Will remain free from infection Outcome: Progressing Goal: Diagnostic test results will improve Outcome: Progressing Goal: Respiratory complications will improve Outcome: Progressing Goal: Cardiovascular complication will be avoided Outcome: Progressing   Problem: Activity: Goal: Risk for activity intolerance will decrease Outcome: Progressing   Problem: Nutrition: Goal: Adequate nutrition will be maintained Outcome: Progressing   Problem: Coping: Goal: Level of anxiety will decrease Outcome: Progressing    Problem: Elimination: Goal: Will not experience complications related to bowel motility Outcome: Progressing Goal: Will not experience complications related to urinary retention Outcome: Progressing   Problem: Pain Managment: Goal: General experience of comfort will improve Outcome: Progressing   Problem: Safety: Goal: Ability to remain free from injury will improve Outcome: Progressing   Problem: Skin Integrity: Goal: Risk for impaired skin integrity will decrease Outcome: Progressing

## 2018-02-27 LAB — CBC
HEMATOCRIT: 32.9 % — AB (ref 36.0–46.0)
Hemoglobin: 10.3 g/dL — ABNORMAL LOW (ref 12.0–15.0)
MCH: 27 pg (ref 26.0–34.0)
MCHC: 31.3 g/dL (ref 30.0–36.0)
MCV: 86.1 fL (ref 78.0–100.0)
PLATELETS: 223 10*3/uL (ref 150–400)
RBC: 3.82 MIL/uL — ABNORMAL LOW (ref 3.87–5.11)
RDW: 18.1 % — AB (ref 11.5–15.5)
WBC: 11 10*3/uL — AB (ref 4.0–10.5)

## 2018-02-27 LAB — BASIC METABOLIC PANEL
Anion gap: 9 (ref 5–15)
BUN: 12 mg/dL (ref 6–20)
CHLORIDE: 98 mmol/L — AB (ref 101–111)
CO2: 26 mmol/L (ref 22–32)
CREATININE: 0.67 mg/dL (ref 0.44–1.00)
Calcium: 8.2 mg/dL — ABNORMAL LOW (ref 8.9–10.3)
GFR calc Af Amer: >60 mL/min (ref >60)
GFR calc non Af Amer: >60 mL/min (ref >60)
GLUCOSE: 91 mg/dL (ref 65–99)
Potassium: 3.1 mmol/L — ABNORMAL LOW (ref 3.5–5.1)
Sodium: 133 mmol/L — ABNORMAL LOW (ref 135–145)

## 2018-02-27 MED ORDER — ATORVASTATIN CALCIUM 10 MG PO TABS: 10.0000 mg | ORAL_TABLET | Freq: Every day | ORAL | Status: DC

## 2018-02-27 MED ORDER — MOMETASONE FURO-FORMOTEROL FUM 100-5 MCG/ACT IN AERO: 1.0000 | INHALATION_SPRAY | Freq: Two times a day (BID) | RESPIRATORY_TRACT | Status: DC

## 2018-02-27 MED ORDER — FERROUS SULFATE 325 (65 FE) MG PO TABS: 325.0000 mg | ORAL_TABLET | Freq: Three times a day (TID) | ORAL | 3 refills | Status: DC

## 2018-02-27 MED ORDER — POTASSIUM CHLORIDE CRYS ER 20 MEQ PO TBCR
20.0000 meq | EXTENDED_RELEASE_TABLET | Freq: Two times a day (BID) | ORAL | Status: DC
  Administered 2018-02-27: 20 meq via ORAL
  Filled 2018-02-27: qty 1

## 2018-02-27 MED ORDER — METOPROLOL SUCCINATE ER 25 MG PO TB24: 25.0000 mg | ORAL_TABLET | Freq: Every day | ORAL | Status: DC

## 2018-02-27 MED ORDER — POTASSIUM CHLORIDE CRYS ER 20 MEQ PO TBCR: 20.0000 meq | EXTENDED_RELEASE_TABLET | Freq: Two times a day (BID) | ORAL | Status: DC

## 2018-02-27 MED ORDER — DOCUSATE SODIUM 100 MG PO CAPS: 100.0000 mg | ORAL_CAPSULE | Freq: Two times a day (BID) | ORAL | 0 refills | Status: AC

## 2018-02-27 MED ORDER — ALBUTEROL SULFATE (2.5 MG/3ML) 0.083% IN NEBU: 3.0000 mL | INHALATION_SOLUTION | RESPIRATORY_TRACT | 12 refills | Status: AC | PRN

## 2018-02-27 NOTE — Progress Notes (Signed)
Report called to [facility]. No further questions at this time. AVS printed and placed on patients chart. Waiting for [facility] transport.

## 2018-02-27 NOTE — Progress Notes (Signed)
[facility] Progress Note  Krista Cook 84 y.o. 12/27/1914   Subjective: Thinks she has gas pains. Feels ok. No family at bedside.  Objective: Vital signs: Vitals:   02/27/18 0755 02/27/18 0915  BP: (!) 122/59   Pulse: 65   Resp: 18   Temp: 98.9 F (37.2 C)   SpO2: 94% 95%    Physical Exam: Gen: elderly, lethargic, cachetic, no acute distress, pleasant HEENT: anicteric sclera CV: RRR Chest: CTA B Abd: minimal tenderness without guarding, soft, nondistended, +BS  Lab Results: Recent Labs    02/26/18 0706 02/27/18 0618  NA 134* 133*  K 3.2* 3.1*  CL 99* 98*  CO2 25 26  GLUCOSE 89 91  BUN 15 12  CREATININE 0.78 0.67  CALCIUM 8.2* 8.2*   No results for input(s): AST, ALT, ALKPHOS, BILITOT, PROT, ALBUMIN in the last 72 hours. Recent Labs    02/26/18 0706 02/27/18 0618  WBC 11.4* 11.0*  HGB 10.1* 10.3*  HCT 32.4* 32.9*  MCV 85.5 86.1  PLT 234 223      Assessment/Plan: Anemia in the setting of multiple comorbidities and advanced age - conservative management. Hgb stable at 10.3. Would NOT recommend endoscopic evaluation. Will sign off. Call if questions.   Krista Cook C. 02/27/2018, 10:39 AM  Pager [phone]  AFTER 5 PM or on weekends please call [phone] ID: Krista Cook, female   DOB: 03/30/1915, 84 y.o.   MRN: [id number]

## 2018-02-27 NOTE — Clinical Social Work Note (Signed)
Clinical Social Worker facilitated patient discharge including contacting patient family and facility to confirm patient discharge plans.  Clinical information faxed to facility and family agreeable with plan.  CSW arranged ambulance transport via [facility] to [facility] .  RN to call [phone] for report prior to discharge.  Clinical Social Worker will sign off for now as social work intervention is no longer needed. Please consult us again if new need arises.  Ailyn Gladd, LCSWA [phone]   

## 2018-02-27 NOTE — Progress Notes (Signed)
STROKE TEAM PROGRESS NOTE   SUBJECTIVE (INTERVAL HISTORY) HGB up after transfusion and remaining stable today. Patient has elected not to pursue further workup. Medical stable for d/c once bed found. Patient to go to SNF today. No family at bedside. Patient pleasant today, asks if she is going to rehab today.   OBJECTIVE Vitals:   02/26/18 2000 02/27/18 0005 02/27/18 0400 02/27/18 0755  BP: 118/68 119/68 108/74 (!) 122/59  Pulse: 98 92 85 65  Resp: 20 17 18 18   Temp: 98.5 F (36.9 C) 98.8 F (37.1 C) 98.2 F (36.8 C) 98.9 F (37.2 C)  TempSrc: Oral Oral Oral Oral  SpO2: 96% 96% 93% 94%  Weight:      Height:        CBC:  Recent Labs  Lab 02/22/18 2034  02/26/18 0706 02/27/18 0618  WBC 10.0   < > 11.4* 11.0*  NEUTROABS 5.9  --   --   --   HGB 7.0*   < > 10.1* 10.3*  HCT 22.3*   < > 32.4* 32.9*  MCV 80.8   < > 85.5 86.1  PLT 288   < > 234 223   < > = values in this interval not displayed.    Basic Metabolic Panel:  Recent Labs  Lab 02/26/18 0706 02/27/18 0618  NA 134* 133*  K 3.2* 3.1*  CL 99* 98*  CO2 25 26  GLUCOSE 89 91  BUN 15 12  CREATININE 0.78 0.67  CALCIUM 8.2* 8.2*    Lipid Panel:     Component Value Date/Time   CHOL 138 02/23/2018 0334   TRIG 33 02/23/2018 0334   HDL 53 02/23/2018 0334   CHOLHDL 2.6 02/23/2018 0334   VLDL 7 02/23/2018 0334   LDLCALC 78 02/23/2018 0334   HgbA1c:  Lab Results  Component Value Date   HGBA1C 5.2 02/23/2018    IMAGING  Ct Head Code Stroke Wo Contrast 02/22/2018 1. No acute intracranial infarct or other process identified. 2. ASPECTS is 10. 3. Generalized age-related cerebral atrophy with mild to moderate chronic small vessel ischemic disease.   Ct Head Wo Contrast 02/23/2018 Evolving nonhemorrhagic right basal ganglia infarct.   Dg Chest Port 1 View 02/23/2018 1. Cardiomegaly with vascular congestion and mild diffuse interstitial opacity consistent with edema 2. Small pleural effusions with bibasilar  atelectasis or pneumonia.   Carotid Doppler   There is 1-39% bilateral ICA stenosis. Vertebral artery flow is antegrade.    TTE  - Left ventricle: The cavity size was normal. Wall thickness was normal. Systolic function was normal. The estimated ejection fraction was in the range of 60% to 65%. Wall motion was normal; there were no regional wall motion abnormalities. Indeterminant diastolic function (atrial fibrillation). - Ventricular septum: D-shaped interventricular septum suggestive of RV pressure/volume overload. - Aortic valve: There was trivial regurgitation. - Mitral valve: Moderately calcified annulus. There was mild regurgitation. - Left atrium: The atrium was severely dilated. - Right ventricle: The cavity size was moderately dilated. Systolic function was moderately reduced. - Right atrium: The atrium was severely dilated. - Tricuspid valve: There was severe regurgitation. There was systolic flow reversal in the hepatic vein doppler pattern. Peak RV-RA gradient (S): 92 mm Hg. - Pulmonary arteries: PA peak pressure: 107 mm Hg (S). - Systemic veins: IVC measured 2.2 cm with < 50% respirophasic variation, suggesting RA pressure 15 mmHg. - Pericardium, extracardiac: Pleural effusion noted. Impressions:  The patient appeared to be in atrial fibrillation. Normal LV size  with EF 60-65%. Moderately dilated RV with moderately decreased systolic function. Severe biatrial enlargement. Severe tricuspid regurgitation. Severe pulmonary hypertension. Mild mitral regurgitation.   PHYSICAL EXAM Stable physical and neuro exam. General - elderly thin but well developed, in no apparent distress, up in the bed. BUE dark bruising on arms and legs, no active bleeding noted. Ophthalmologic - did not assess Cardiovascular - Irregular rate and rhythm, no tachycardia. Lung - clear bilaterally. Mental Status - Alert and oriented to person, place and year, situation. Follows complex commands. Speech fluent,  able to name and repeat.  Extremities without edema, pulses 2+  Cranial Nerves II - XII - II - no field cut, PERRL III, IV, VI - extraocular movements intact. V - Facial sensation intact bilaterally. VII - facial droop improved, improving mild dysarthria. VIII - Mildly HOH (hearing aids) XI - SCMs intact XII - tongue midline  Motor Strength -  No UE or LE drift. BUE grip strong. LUE 5/5, RUE 5/5. R arm orbits L. LLE 4+/5, RLE 5/5. Muscle bulk decreased Reflexes - Did not assess Sensory - intact, symmetric bilaterally Coordination - no ataxia  Gait and Station - not tested.  No noted bleeding  No change in exam since yesterday.   ASSESSMENT/PLAN [name] is a [age] y.o. female with history of AF not on AC, CHF, COPD and PN presenting with L dysphagia, L HP, L facial, L neglect. She received IV t-PA 02/22/2018 at 1758. She was not considered for endovascular intervention given age and risk.  Stroke:  R basal ganglia infarct embolic secondary to known atrial fibrillation not on AC, s/p IV tPA   Resultant  Mild L hp and L facial drooop   Code Stroke CT head no acute abnormality, ASPECTS 10, Small vessel disease. Atrophy.   MRI head canceled d/t back pain yesterday. Pt agreeable to try today. Dr. Roda ShuttersXu discussed with daughter.  MRA head canceled d/t back pain yesterday. Pt agreeable to try today. Dr. Roda ShuttersXu discussed with daughter.  Repeat CT shows R BG infarct  Carotid Doppler  B ICA 1-39% stenosis, VAs antegrade   2D Echo  EF 60-65%. No source of embolus. Decreased systolic fxn, TR, MR. Severe biatrial enlargement, pulm HTN.  LDL 78  HgbA1c 5.2  SCDs for VTE prophylaxis Fall precautions  Diet regular Room service appropriate? Yes; Fluid consistency: Thin - passed SLP swallow assessment for regular diet.  aspirin 81 mg daily prior to admission, started on aspirin 81 mg daily as hgb stabilized with plans for eliquis today, however, Hgb now lower and meds d/c'd(see AF &  anemia discussion below)  Ongoing aggressive stroke risk factor management  Therapy recommendations:  SNF   Disposition:  SNF (from [facility] [place] w/ some hired assistance with transport to meals & bath max 1-2h/d. [place] [facility]  Medical stable for d/c once bed found.  DNR as PTA  Atrial Fibrillation  Home anticoagulation:  No antithrombotic. Had been on warfarin in the past but was d/c'd d/t fall risk 5 years ago. Patient reports she did not fall. dtr reports 2 recent falls but not severe   On metoprolol 50 daily  CHA2DS2-VASc Score = 6, (?2 oral anticoagulation recommended)  Age in Years:  ?[age]   +2    Sex:  Female   +1    Hypertension History:  0     Diabetes Mellitus:  0  Congestive Heart Failure History:  +1  Vascular Disease History:  0     Stroke/TIA/Thromboembolism History:  yes   +2  Home metoprolol resumed  D/c aspirin 81 mg and planned eliquis given low HGB   Hyperlipidemia  Home meds:  No statin  LDL 78, goal < 70  lipitor 10 mg daily  Continue statin at discharge  Fe Deficiency Anemia  Hgb 8.2 on admission ->7.0.>7.6->6.7. Hgb 12 in Sept 2018 per Care Everywhere.   No hx anemia per pt/dtr  B arms dark ecchymosis, no active bleeding noted  Fer 10, barely low  Fe 12. low  Normal:  folate, haptoglobin, LDH, Vit B12, TIBC  Fe added 325 tid  On colace at home bid, resumed IP  Daughter stated that pt not eating well at home  Nutrition on board  guaiac stools - visual inspection soft brown, not dark. guaiac positive  transfused 2 units PRBC w/ lasix during and after 02/25/2018. Pt tolerated well. Hgb post transfusion 10.1 this am  GI consulted - likely slow GI bleed. Supportive care w/ PPI, serial CBCs and transfusions as needed  Other Stroke Risk Factors  Advanced age  UDS / ETOH level not performed   Hx CHF. Lasix on hold during admission. Lungs cl w/o LE edema. Will given during blood  transfusion  Other Active Problems  PN on  neurontin  COPD  Renal insufficiency, resolved 34/1.4->20/0.96  Hx back pain - fell in Dec. Pt refused workup, imaging, therapy. Dtr and pt do not desire extreme heroic measures. They desire a comfortable, pain free life.  Hospital day # 5  Personally examined patient and images, and have participated in and made any corrections needed to history, physical, neuro exam,assessment and plan as stated above.  I have personally obtained the history, evaluated lab date, reviewed imaging studies and agree with radiology interpretations.   Lovely patient with history of A. fib not on anticoagulation, COPD, CHF, acute dysarthria and left-sided weakness, status post TPA, carotids unremarkable, ejection fraction normal but severely dilated left atrium.  MRI and MRA not able to perform.  Hemoglobin A1c and LDL unremarkable.  Found to have severe anemia and was given blood transfusion, her hemoglobin is stable today.  Stool occult blood positive GI consulted however patient refused treatment.  Symptoms likely due to A. fib not on anticoagulation.  However due to her severe anemia and her refusal for workup she is not a candidate for anti-thrombotic medications at this time.  Recommend frequent CBC monitoring at the SNF   Naomie Dean, MD Stroke Neurology   To contact Stroke Continuity provider, please refer to [email or web address]. After hours, contact General Neurology

## 2018-02-27 NOTE — Clinical Social Work Placement (Signed)
   CLINICAL SOCIAL WORK PLACEMENT  NOTE  Date:  02/27/2018  Patient Details  Name: Krista Cook MRN: [id number] Date of Birth: 11/14/1915  Clinical Social Work is seeking post-discharge placement for this patient at the Skilled  Nursing Facility level of care (*CSW will initial, date and re-position this form in  chart as items are completed):      Patient/family provided with [facility] Clinical Social Work Department's list of facilities offering this level of care within the geographic area requested by the patient (or if unable, by the patient's family).  Yes   Patient/family informed of their freedom to choose among providers that offer the needed level of care, that participate in Medicare, Medicaid or managed care program needed by the patient, have an available bed and are willing to accept the patient.      Patient/family informed of [place]'s ownership interest in [facility] and [facility], as well as of the fact that they are under no obligation to receive care at these facilities.  PASRR submitted to EDS on       PASRR number received on 02/25/18     Existing PASRR number confirmed on       FL2 transmitted to all facilities in geographic area requested by pt/family on 02/25/18     FL2 transmitted to all facilities within larger geographic area on       Patient informed that his/her managed care company has contracts with or will negotiate with certain facilities, including the following:        Yes   Patient/family informed of bed offers received.  Patient chooses bed at [facility]     Physician recommends and patient chooses bed at      Patient to be [facility] on 02/27/18.  Patient to be transferred to facility by ptar     Patient family notified on 02/27/18 of transfer.  Name of family member notified:  Krista Cook (daughter)     PHYSICIAN Please prepare priority discharge summary, including  medications, Please prepare prescriptions, Please sign DNR     Additional Comment:    _______________________________________________ Maree KrabbeBridget A Baneza Bartoszek, LCSW 02/27/2018, 1:07 PM

## 2018-03-01 ENCOUNTER — Encounter: Payer: Self-pay | Admitting: Internal Medicine

## 2018-03-01 ENCOUNTER — Non-Acute Institutional Stay (SKILLED_NURSING_FACILITY): Payer: Medicare Other | Admitting: Internal Medicine

## 2018-03-01 DIAGNOSIS — D500 Iron deficiency anemia secondary to blood loss (chronic): Secondary | ICD-10-CM | POA: Diagnosis not present

## 2018-03-01 DIAGNOSIS — I639 Cerebral infarction, unspecified: Secondary | ICD-10-CM | POA: Diagnosis not present

## 2018-03-01 DIAGNOSIS — E785 Hyperlipidemia, unspecified: Secondary | ICD-10-CM | POA: Diagnosis not present

## 2018-03-01 DIAGNOSIS — G63 Polyneuropathy in diseases classified elsewhere: Secondary | ICD-10-CM | POA: Diagnosis not present

## 2018-03-01 DIAGNOSIS — I10 Essential (primary) hypertension: Secondary | ICD-10-CM

## 2018-03-01 DIAGNOSIS — I482 Chronic atrial fibrillation, unspecified: Secondary | ICD-10-CM

## 2018-03-01 NOTE — Progress Notes (Signed)
: Provider:  Randon Goldsmith. Lyn Hollingshead, MD Location:  [facility] Room Number: [phone] Place of Service:  SNF ([phone])  PCP: Loyal Jacobson, MD Patient Care Team: Loyal Jacobson, MD as PCP - General (Family Medicine)  Extended Emergency Contact Information Primary Emergency Contact: [facility] Phone: [phone] Mobile Phone: [phone] Relation: Daughter     Allergies: Celebrex [celecoxib]; Moxifloxacin; and Ultram [tramadol]  Chief Complaint  Patient presents with  . New Admit To SNF    Admit to Facility    HPI: Patient is 84 y.o. female with atrial fibrillation not on anticoagulation, COPD, congestive heart failure, peripheral neuropathy, who was at home with her caretaker and was  was noted to have sudden onset dysarthria and left-sided weakness.  EMS was called and patient was brought to [facility] emergency department where patient was noted to have left-sided neglect in addition to left facial droop dysarthria and left-sided weakness.  CT head showed no acute findings.  She received IV TPA after speaking with her daughter.   patient was admitted to [facility] from 3/25-30 where she was treated for post TPA evaluation.  Showed right basal ganglia infarct embolic secondary to known atrial fibrillation.   Patient was placed on aspirin and Eliquis.  hospital course was complicated by a hemoglobin dropping to 6.7 and patient was transfused with 2 units PRBC.  Patient is admitted to skilled nursing facility for OT/PT while at skilled nursing facility patient will be followed for.  Neuropathy treated with Neurontin atrial fibrillation treated with metoprolol and hypertension treated with metoprolol.   Past Medical History:  Diagnosis Date  . Arthritis   . Edema   . GERD (gastroesophageal reflux disease)   . Glaucoma   . Macular degeneration   . Rapid heart rate     Past Surgical History:  Procedure Laterality Date  . CATARACT  EXTRACTION, BILATERAL    . CESAREAN SECTION    . TONSILLECTOMY      Allergies as of 03/01/2018      Reactions   Celebrex [celecoxib]    Moxifloxacin Diarrhea, Nausea Only   Ultram [tramadol]       Medication List        Accurate as of 03/01/18 10:27 AM. Always use your most recent med list.          albuterol (2.5 MG/3ML) 0.083% nebulizer solution Commonly known as:  PROVENTIL Inhale 3 mLs into the lungs every 4 (four) hours as needed for wheezing or shortness of breath.   atorvastatin 10 MG tablet Commonly known as:  LIPITOR Take 1 tablet (10 mg total) by mouth daily at 6 PM.   docusate sodium 100 MG capsule Commonly known as:  COLACE Take 1 capsule (100 mg total) by mouth 2 (two) times daily.   ferrous sulfate 325 (65 FE) MG tablet Take 1 tablet (325 mg total) by mouth 3 (three) times daily with meals.   gabapentin 100 MG capsule Commonly known as:  NEURONTIN Take 500 mg by mouth at bedtime.   metoprolol succinate 25 MG 24 hr tablet Commonly known as:  TOPROL-XL Take 1 tablet (25 mg total) by mouth daily.   mometasone-formoterol 100-5 MCG/ACT Aero Commonly known as:  DULERA Inhale 1 puff into the lungs 2 (two) times daily.   moxifloxacin 0.5 % ophthalmic solution Commonly known as:  VIGAMOX Place 1 drop into both eyes 3 (three) times daily.   omeprazole 40 MG capsule Commonly known as:  PRILOSEC Take 40  mg by mouth daily.   potassium chloride SA 20 MEQ tablet Commonly known as:  K-DUR,KLOR-CON Take 1 tablet (20 mEq total) by mouth 2 (two) times daily.   travoprost (benzalkonium) 0.004 % ophthalmic solution Commonly known as:  TRAVATAN Place 1 drop into both eyes at bedtime.       No orders of the defined types were placed in this encounter.   Immunization History  Administered Date(s) Administered  . Influenza, High Dose Seasonal PF 09/06/2015, 09/09/2016, 08/25/2017  . Pneumococcal Conjugate-13 07/26/2014  . Pneumococcal Polysaccharide-23  04/25/2011  . Zoster 07/06/2013    Social History   Tobacco Use  . Smoking status: Never Smoker  . Smokeless tobacco: Never Used  Substance Use Topics  . Alcohol use: Not Currently    Frequency: Never    Family history is unable to be obtained secondary to dementia  History reviewed. No pertinent family history.    Review of Systems  DATA OBTAINED: from patient-limited nursing-; no acute concerns GENERAL:  no fevers, fatigue, appetite changes SKIN: No itching, or rash EYES: No eye pain, redness, discharge EARS: No earache, tinnitus, change in hearing NOSE: No congestion, drainage or bleeding  MOUTH/THROAT: No mouth or tooth pain, No sore throat RESPIRATORY: No cough, wheezing, SOB CARDIAC: No chest pain, palpitations, lower extremity edema  GI: No abdominal pain, No N/V/D or constipation, No heartburn or reflux  GU: No dysuria, frequency or urgency, or incontinence  MUSCULOSKELETAL: No unrelieved bone/joint pain NEUROLOGIC: No headache, dizziness or focal weakness PSYCHIATRIC: No c/o anxiety or sadness   Vitals:   03/01/18 0957  BP: 140/65  Pulse: 89  Resp: 20  Temp: 97.9 F (36.6 C)  SpO2: 97%    SpO2 Readings from Last 1 Encounters:  03/01/18 97%   Body mass index is 20.85 kg/m.     Physical Exam  GENERAL APPEARANCE: Alert, conversant,  No acute distress.  SKIN: No diaphoresis rash HEAD: Normocephalic, atraumatic  EYES: Conjunctiva/lids clear. Pupils round, reactive. EOMs intact.  EARS: External exam WNL, canals clear. Hearing grossly normal.  NOSE: No deformity or discharge.  MOUTH/THROAT: Lips w/o lesions  RESPIRATORY: Breathing is even, unlabored. Lung sounds are clear   CARDIOVASCULAR: Heart RRR no murmurs, rubs or gallops. No peripheral edema.   GASTROINTESTINAL: Abdomen is soft, non-tender, not distended is w/ normal bowel sounds. GENITOURINARY: Bladder non tender, not distended  MUSCULOSKELETAL: No abnormal joints or musculature NEUROLOGIC:   Cranial nerves 2-12 grossly intact. Moves all extremities  PSYCHIATRIC: Mood and affect appropriate to situation with dementia, no behavioral issues  Patient Active Problem List   Diagnosis Date Noted  . Atrial fibrillation (HCC) 02/26/2018  . Hyperlipidemia LDL goal <70 02/26/2018  . Fe deficiency anemia d/t slow GI bleed 02/26/2018  . Peripheral neuropathy 02/26/2018  . Right sided cerebral infarction [facility]) R Basal ganglia s/p IV tPA 02/22/2018      Labs reviewed: Basic Metabolic Panel:    Component Value Date/Time   NA 133 (L) 02/27/2018 0618   K 3.1 (L) 02/27/2018 0618   CL 98 (L) 02/27/2018 0618   CO2 26 02/27/2018 0618   GLUCOSE 91 02/27/2018 0618   BUN 12 02/27/2018 0618   CREATININE 0.67 02/27/2018 0618   CALCIUM 8.2 (L) 02/27/2018 0618   PROT 5.7 (L) 02/22/2018 1739   ALBUMIN 3.0 (L) 02/22/2018 1739   AST 19 02/22/2018 1739   ALT 8 (L) 02/22/2018 1739   ALKPHOS 76 02/22/2018 1739   BILITOT 0.7 02/22/2018 1739   GFRNONAA >  60 02/27/2018 0618   GFRAA >60 02/27/2018 0618    Recent Labs    02/25/18 0520 02/26/18 0706 02/27/18 0618  NA 135 134* 133*  K 3.7 3.2* 3.1*  CL 104 99* 98*  CO2 23 25 26   GLUCOSE 89 89 91  BUN 15 15 12   CREATININE 0.85 0.78 0.67  CALCIUM 8.1* 8.2* 8.2*   Liver Function Tests: Recent Labs    02/22/18 1739  AST 19  ALT 8*  ALKPHOS 76  BILITOT 0.7  PROT 5.7*  ALBUMIN 3.0*   No results for input(s): LIPASE, AMYLASE in the last 8760 hours. No results for input(s): AMMONIA in the last 8760 hours. CBC: Recent Labs    02/22/18 2034  02/25/18 0520 02/25/18 2307 02/26/18 0706 02/27/18 0618  WBC 10.0   < > 7.8  --  11.4* 11.0*  NEUTROABS 5.9  --   --   --   --   --   HGB 7.0*   < > 6.7* 10.5* 10.1* 10.3*  HCT 22.3*   < > 21.5*  --  32.4* 32.9*  MCV 80.8   < > 83.0  --  85.5 86.1  PLT 288   < > 230  --  234 223   < > = values in this interval not displayed.   Lipid Recent Labs    02/23/18 0334  CHOL 138  HDL 53    LDLCALC 78  TRIG 33    Cardiac Enzymes: No results for input(s): CKTOTAL, CKMB, CKMBINDEX, TROPONINI in the last 8760 hours. BNP: No results for input(s): BNP in the last 8760 hours. No results found for: [facility] Lab Results  Component Value Date   HGBA1C 5.2 02/23/2018   No results found for: TSH Lab Results  Component Value Date   VITAMINB12 541 02/23/2018   Lab Results  Component Value Date   FOLATE 14.5 02/23/2018   Lab Results  Component Value Date   IRON 12 (L) 02/23/2018   TIBC 421 02/23/2018   FERRITIN 10 (L) 02/23/2018    Imaging and Procedures obtained prior to SNF admission: Ct Head Wo Contrast  Result Date: 02/23/2018 CLINICAL DATA:  Dysarthria and left-sided weakness. EXAM: CT HEAD WITHOUT CONTRAST TECHNIQUE: Contiguous axial images were obtained from the base of the skull through the vertex without intravenous contrast. COMPARISON:  02/22/2018 FINDINGS: Brain: There is a developing region of low density measuring approximately 2.5 cm in the right basal ganglia involving both the lentiform and caudate nuclei as well as anterior limb of the internal capsule. There is no associated hemorrhage or mass effect. Patchy cerebral white matter hypodensities bilaterally are nonspecific but compatible with mild-to-moderate chronic small vessel ischemic disease. There is mild cerebral atrophy. No mass, midline shift, or extra-axial fluid collection is present. Vascular: Calcified atherosclerosis at the skull base. No hyperdense vessel. Skull: No fracture or focal osseous lesion. Sinuses/Orbits: Visualized paranasal sinuses and mastoid air cells are clear. Bilateral cataract extraction is noted. Other: None. IMPRESSION: Evolving nonhemorrhagic right basal ganglia infarct. Electronically Signed   By: Sebastian Ache M.D.   On: 02/23/2018 15:21   Dg Chest Port 1 View  Result Date: 02/23/2018 CLINICAL DATA:  Stroke EXAM: PORTABLE CHEST 1 VIEW COMPARISON:  08/20/2017 FINDINGS: Small  bilateral pleural effusions. Left greater than right bibasilar airspace disease. Cardiomegaly with vascular congestion and mild diffuse edema. Aortic atherosclerosis. No pneumothorax. IMPRESSION: 1. Cardiomegaly with vascular congestion and mild diffuse interstitial opacity consistent with edema 2. Small pleural effusions with bibasilar  atelectasis or pneumonia. Electronically Signed   By: Jasmine PangKim  Fujinaga M.D.   On: 02/23/2018 19:55   Ct Head Code Stroke Wo Contrast  Result Date: 02/22/2018 CLINICAL DATA:  Code stroke. Initial evaluation for acute slurred speech, left-sided deficits. EXAM: CT HEAD WITHOUT CONTRAST TECHNIQUE: Contiguous axial images were obtained from the base of the skull through the vertex without intravenous contrast. COMPARISON:  None. FINDINGS: Brain: Generalized age-related cerebral atrophy. Confluent hypodensity within the periventricular white matter most consistent with chronic small vessel ischemic disease, mild to moderate nature. No acute intracranial hemorrhage. No acute large vessel territory infarct. No mass lesion, midline shift or mass effect. Mild diffuse ventricular prominence related global parenchymal volume loss of hydrocephalus. No extra-axial fluid collection. Vascular: No asymmetric hyperdense vessel. Scattered vascular calcifications noted within the carotid siphons. Skull: Scalp soft tissues and calvarium demonstrate no acute abnormality. Sinuses/Orbits: Globes and orbital soft tissues within normal limits. Paranasal sinuses and mastoid air cells are clear. Other: None. ASPECTS [facility]([place] Stroke Program Early CT Score) - Ganglionic level infarction (caudate, lentiform nuclei, internal capsule, insula, M1-M3 cortex): 7 - Supraganglionic infarction (M4-M6 cortex): 3 Total score (0-10 with 10 being normal): 10 IMPRESSION: 1. No acute intracranial infarct or other process identified. 2. ASPECTS is 10. 3. Generalized age-related cerebral atrophy with mild to moderate chronic  small vessel ischemic disease. These results were communicated to Dr. Laurence SlateAroor at 6:04 pmon 3/25/2019by text page via the [facility] messaging system. Electronically Signed   By: Rise MuBenjamin  McClintock M.D.   On: 02/22/2018 18:09     Not all labs, radiology exams or other studies done during hospitalization come through on my EPIC note; however they are reviewed by me.    Assessment and Plan  Status post stroke-right basal ganglia infarct embolic-status post IV TPA with resultant very mild left hemiparesis and facial droop MRI of the head showed above carotid Doppler; bilateral ICA low-grade; 2D echo with EF 60-65% and no source of embolus, severe biatrial enlargement and pulmonary hypertension; LDL 78, A1c 5.2; patient was on aspirin prior to admission was started back on aspirin 81 mg daily  but not an anticoagulant  SNF -continue aspirin 81 mg daily; admitted for OT/PT  Atrial fibrillation-was no longer on warfarin secondary to falls SNF -continue metoprolol XL 25 mg daily, and ASA 81 mg daily as prophylaxis  Hyperlipidemia- LDL was 78 with goal of less than 70 so Lipitor 10 mg started SNF -continue Lipitor 10 mg daily  Iron deficiency anemia-admission hemoglobin 8.2, fell to 6.7; patient was transfused 2 units PRBC; stools were guaiac positive so likely slow GI bleed; PPIs were added and transfusions as needed SNF -continue iron 325 mg 3 times daily and omeprazole 40 mg daily  Hypertension SNF -continue Toprol-XL 25 mg daily; controlled   polyneuropathy SNF -  Controlled; continue Neurontin 500 mg nightly    Time spent greater than 45 minutes;> 50% of time with patient was spent reviewing records, labs, tests and studies, counseling and developing plan of care As is Thurston Holenne D. Lyn HollingsheadAlexander, MD

## 2018-03-03 ENCOUNTER — Encounter: Payer: Self-pay | Admitting: Internal Medicine

## 2018-03-03 DIAGNOSIS — I10 Essential (primary) hypertension: Secondary | ICD-10-CM | POA: Insufficient documentation

## 2018-03-08 ENCOUNTER — Encounter: Payer: Self-pay | Admitting: Adult Health

## 2018-03-08 ENCOUNTER — Non-Acute Institutional Stay (SKILLED_NURSING_FACILITY): Payer: Medicare Other | Admitting: Adult Health

## 2018-03-08 DIAGNOSIS — I639 Cerebral infarction, unspecified: Secondary | ICD-10-CM

## 2018-03-08 NOTE — Progress Notes (Signed)
Location:   [facility] Room Number: 72 W Place of Service:  SNF ([id number])   CODE STATUS: Full Code  Allergies  Allergen Reactions  . Celebrex [Celecoxib]   . Moxifloxacin Diarrhea and Nausea Only  . Ultram [Tramadol]     Chief Complaint  Patient presents with  . Acute Visit    Change in Status    HPI:  Staff report that overnight she has had a change in her status she has right facial droop and is unable to verbally respond. More than likely this does represent an acute cva. Her family does not want aggressive care to be done and desire for comfort care only. She is unable to participate in the hpi or ros. She is able to swallow at this time. There are no reports of fevers present.    Past Medical History:  Diagnosis Date  . Arthritis   . Edema   . GERD (gastroesophageal reflux disease)   . Glaucoma   . Macular degeneration   . Rapid heart rate     Past Surgical History:  Procedure Laterality Date  . CATARACT EXTRACTION, BILATERAL    . CESAREAN SECTION    . TONSILLECTOMY      Social History   Socioeconomic History  . Marital status: Widowed    Spouse name: Not on file  . Number of children: Not on file  . Years of education: Not on file  . Highest education level: Not on file  Occupational History  . Not on file  Social Needs  . Financial resource strain: Not on file  . Food insecurity:    Worry: Not on file    Inability: Not on file  . Transportation needs:    Medical: Not on file    Non-medical: Not on file  Tobacco Use  . Smoking status: Never Smoker  . Smokeless tobacco: Never Used  Substance and Sexual Activity  . Alcohol use: Not Currently    Frequency: Never  . Drug use: Not on file  . Sexual activity: Not on file  Lifestyle  . Physical activity:    Days per week: Not on file    Minutes per session: Not on file  . Stress: Not on file  Relationships  . Social connections:    Talks on phone: Not on file   Gets together: Not on file    Attends religious service: Not on file    Active member of club or organization: Not on file    Attends meetings of clubs or organizations: Not on file    Relationship status: Not on file  . Intimate partner violence:    Fear of current or ex partner: Not on file    Emotionally abused: Not on file    Physically abused: Not on file    Forced sexual activity: Not on file  Other Topics Concern  . Not on file  Social History Narrative  . Not on file   History reviewed. No pertinent family history.    VITAL SIGNS BP (!) 142/80   Pulse (!) 102   Temp 98.7 F (37.1 C)   Resp (!) 24   Ht 5\' 2"  (1.575 m)   Wt 106 lb 9.6 oz (48.4 kg)   BMI 19.50 kg/m   Outpatient Encounter Medications as of 03/08/2018  Medication Sig  . albuterol (PROVENTIL) (2.5 MG/3ML) 0.083% nebulizer solution Inhale 3 mLs into the lungs every 4 (four) hours as needed for wheezing or  shortness of breath.  Marland Kitchen. atorvastatin (LIPITOR) 10 MG tablet Take 1 tablet (10 mg total) by mouth daily at 6 PM.  . docusate sodium (COLACE) 100 MG capsule Take 1 capsule (100 mg total) by mouth 2 (two) times daily.  . ferrous sulfate 325 (65 FE) MG tablet Take 1 tablet (325 mg total) by mouth 3 (three) times daily with meals.  . gabapentin (NEURONTIN) 100 MG capsule Take 500 mg by mouth at bedtime.  . metoprolol succinate (TOPROL-XL) 25 MG 24 hr tablet Take 1 tablet (25 mg total) by mouth daily.  . mometasone-formoterol (DULERA) 100-5 MCG/ACT AERO Inhale 1 puff into the lungs 2 (two) times daily.  Marland Kitchen. moxifloxacin (VIGAMOX) 0.5 % ophthalmic solution Place 1 drop into both eyes 3 (three) times daily.  Marland Kitchen. omeprazole (PRILOSEC) 40 MG capsule Take 40 mg by mouth daily.  . travoprost, benzalkonium, (TRAVATAN) 0.004 % ophthalmic solution Place 1 drop into both eyes at bedtime.  . [DISCONTINUED] potassium chloride SA (K-DUR,KLOR-CON) 20 MEQ tablet Take 1 tablet (20 mEq total) by mouth 2 (two) times daily. (Patient not  taking: Reported on 03/08/2018)   No facility-administered encounter medications on file as of 03/08/2018.      SIGNIFICANT DIAGNOSTIC EXAMS  TODAY:   02-23-18: CT of head: Evolving nonhemorrhagic right basal ganglia infarct.    LABS REVIEWED: TODAY:   02-27-18: wbc 11.0; hgb 10.3; hct 32.9; mcv 86.1; plt 223; glucose 91; bun 12; creat 0.67; k+ 3.1; na++ 133; ca 8.2     Review of Systems  Unable to perform ROS: Other (nonverbal )    Physical Exam  Constitutional: She appears well-developed and well-nourished. No distress.  Thin   Neck: No thyromegaly present.  Cardiovascular: Normal rate, regular rhythm, normal heart sounds and intact distal pulses.  Pulmonary/Chest: Effort normal and breath sounds normal. No respiratory distress.  Abdominal: Soft. Bowel sounds are normal. She exhibits no distension. There is no tenderness.  Musculoskeletal: She exhibits no edema.  Is not moving extremities at this time.   Lymphadenopathy:    She has no cervical adenopathy.  Neurological: She is alert.  Skin: Skin is warm and dry. She is not diaphoretic.     ASSESSMENT/ PLAN:  TODAY;   1. Right sided cerebral infarction R basal ganglia s/p IV tPA: neurologically she is worse: will setup a palliative care consult.  Will stop lipitor; iron gabapentin; toprol xl; dulera.  Will continue to monitor her status.  More than likely her cva has extended. Her family has decided not hospitalize and desire for comfort care.     MD is aware of resident's narcotic use and is in agreement with current plan of care. We will attempt to wean resident as apropriate   Synthia Innocenteborah Emmaleigh Longo NP [facility]  Contact [phone] Monday through Friday 8am- 5pm  After hours call [phone]

## 2018-03-26 ENCOUNTER — Other Ambulatory Visit: Payer: Self-pay | Admitting: Internal Medicine

## 2018-03-31 ENCOUNTER — Encounter: Payer: Self-pay | Admitting: Internal Medicine

## 2018-03-31 ENCOUNTER — Non-Acute Institutional Stay (SKILLED_NURSING_FACILITY): Payer: Medicare Other | Admitting: Internal Medicine

## 2018-03-31 DIAGNOSIS — D500 Iron deficiency anemia secondary to blood loss (chronic): Secondary | ICD-10-CM | POA: Diagnosis not present

## 2018-03-31 DIAGNOSIS — I10 Essential (primary) hypertension: Secondary | ICD-10-CM

## 2018-03-31 DIAGNOSIS — I639 Cerebral infarction, unspecified: Secondary | ICD-10-CM

## 2018-03-31 NOTE — Progress Notes (Signed)
Location:  [facility] Room Number: [id number] Place of Service:  SNF [phone])  Provider: Randon Goldsmith. Lyn Hollingshead, MD  Loyal Jacobson, MD  Patient Care Team: Loyal Jacobson, MD as PCP - General (Family Medicine)  Extended Emergency Contact Information Primary Emergency Contact: [facility] Phone: [phone] Mobile Phone: [phone] Relation: Daughter    Allergies: Celebrex [celecoxib]; Moxifloxacin; and Ultram [tramadol]  Chief Complaint  Patient presents with  . Medical Management of Chronic Issues    HPI: Patient is 84 y.o. female who is being seen for routine issues for right CVA with extension, hypertension, and anemia.  Past Medical History:  Diagnosis Date  . Arthritis   . Edema   . GERD (gastroesophageal reflux disease)   . Glaucoma   . Macular degeneration   . Rapid heart rate     Past Surgical History:  Procedure Laterality Date  . CATARACT EXTRACTION, BILATERAL    . CESAREAN SECTION    . TONSILLECTOMY      Allergies as of 03/31/2018      Reactions   Celebrex [celecoxib]    Moxifloxacin Diarrhea, Nausea Only   Ultram [tramadol]       Medication List        Accurate as of 03/31/18 11:59 PM. Always use your most recent med list.          acetaminophen 500 MG tablet Commonly known as:  TYLENOL Take 500 mg by mouth 2 (two) times daily.   albuterol (2.5 MG/3ML) 0.083% nebulizer solution Commonly known as:  PROVENTIL Inhale 3 mLs into the lungs every 4 (four) hours as needed for wheezing or shortness of breath.   docusate sodium 100 MG capsule Commonly known as:  COLACE Take 1 capsule (100 mg total) by mouth 2 (two) times daily.   moxifloxacin 0.5 % ophthalmic solution Commonly known as:  VIGAMOX Place 1 drop into both eyes 3 (three) times daily.   NUTRITIONAL SUPPLEMENT PO Take 90 mLs by mouth 4 (four) times daily. Medpass   omeprazole 40 MG capsule Commonly known as:  PRILOSEC Take 40 mg by mouth  daily.   travoprost (benzalkonium) 0.004 % ophthalmic solution Commonly known as:  TRAVATAN Place 1 drop into both eyes at bedtime.       No orders of the defined types were placed in this encounter.   Immunization History  Administered Date(s) Administered  . Influenza, High Dose Seasonal PF 09/06/2015, 09/09/2016, 08/25/2017  . Pneumococcal Conjugate-13 07/26/2014  . Pneumococcal Polysaccharide-23 04/25/2011  . Zoster 07/06/2013    Social History   Tobacco Use  . Smoking status: Never Smoker  . Smokeless tobacco: Never Used  Substance Use Topics  . Alcohol use: Not Currently    Frequency: Never    Review of Systems unable to obtain, patient is nonverbal; nursing- patient is failing to thrive    Vitals:   03/31/18 1438  BP: 94/60  Pulse: 95  Resp: (!) 23  Temp: (!) 97.3 F (36.3 C)  SpO2: 92%   Body mass index is 18.25 kg/m. Physical Exam  GENERAL APPEARANCE: Alert, non-conversant, No acute distress  SKIN: No diaphoresis rash HEENT: Unremarkable RESPIRATORY: Breathing is even, unlabored. Lung sounds are clear   CARDIOVASCULAR: Heart RRR no murmurs, rubs or gallops. No peripheral edema  GASTROINTESTINAL: Abdomen is soft, non-tender, not distended w/ normal bowel sounds.  GENITOURINARY: Bladder non tender, not distended  MUSCULOSKELETAL: No abnormal joints or musculature NEUROLOGIC: Right facial weakness, patient did not move extremities PSYCHIATRIC: Patient  is nonverbal but it seems in no psychological distress  Patient Active Problem List   Diagnosis Date Noted  . Hypertension 03/03/2018  . Atrial fibrillation (HCC) 02/26/2018  . Hyperlipidemia LDL goal <70 02/26/2018  . Fe deficiency anemia d/t slow GI bleed 02/26/2018  . Peripheral neuropathy 02/26/2018  . Right sided cerebral infarction [facility]) R Basal ganglia s/p IV tPA 02/22/2018    CMP     Component Value Date/Time   NA 133 (L) 02/27/2018 0618   K 3.1 (L) 02/27/2018 0618   CL 98 (L)  02/27/2018 0618   CO2 26 02/27/2018 0618   GLUCOSE 91 02/27/2018 0618   BUN 12 02/27/2018 0618   CREATININE 0.67 02/27/2018 0618   CALCIUM 8.2 (L) 02/27/2018 0618   PROT 5.7 (L) 02/22/2018 1739   ALBUMIN 3.0 (L) 02/22/2018 1739   AST 19 02/22/2018 1739   ALT 8 (L) 02/22/2018 1739   ALKPHOS 76 02/22/2018 1739   BILITOT 0.7 02/22/2018 1739   GFRNONAA >60 02/27/2018 0618   GFRAA >60 02/27/2018 0618   Recent Labs    02/25/18 0520 02/26/18 0706 02/27/18 0618  NA 135 134* 133*  K 3.7 3.2* 3.1*  CL 104 99* 98*  CO2 GLUCOSE 89 89 91  BUN CREATININE 0.85 0.78 0.67  CALCIUM 8.1* 8.2* 8.2*   Recent Labs    02/22/18 1739  AST 19  ALT 8*  ALKPHOS 76  BILITOT 0.7  PROT 5.7*  ALBUMIN 3.0*   Recent Labs    02/22/18 2034  02/25/18 0520 02/25/18 2307 02/26/18 0706 02/27/18 0618  WBC 10.0   < > 7.8  --  11.4* 11.0*  NEUTROABS 5.9  --   --   --   --   --   HGB 7.0*   < > 6.7* 10.5* 10.1* 10.3*  HCT 22.3*   < > 21.5*  --  32.4* 32.9*  MCV 80.8   < > 83.0  --  85.5 86.1  PLT 288   < > 230  --  234 223   < > = values in this interval not displayed.   Recent Labs    02/23/18 0334  CHOL 138  LDLCALC 78  TRIG 33   No results found for: MICROALBUR No results found for: TSH Lab Results  Component Value Date   HGBA1C 5.2 02/23/2018   Lab Results  Component Value Date   CHOL 138 02/23/2018   HDL 53 02/23/2018   LDLCALC 78 02/23/2018   TRIG 33 02/23/2018   CHOLHDL 2.6 02/23/2018    Significant Diagnostic Results in last 30 days:  No results found.  Assessment and Plan  Right sided cerebral infarction [facility]) R Basal ganglia s/p IV tPA And patient has had a stroke since while in the skilled nursing facility; family does not desire any intervention; patient is still eating and drinking some but not much continue supportive care  Hypertension Blood pressure is controlled on no meds; meds were stopped April 8 when patient had an extension for  stroke  Fe deficiency anemia d/t slow GI bleed No more labs, iron was stopped per family wishes after patient extended her right CVA in April; continue supportive care    Thurston Hole D. Lyn Hollingshead, MD

## 2018-04-09 ENCOUNTER — Non-Acute Institutional Stay (SKILLED_NURSING_FACILITY): Payer: Medicare Other | Admitting: Internal Medicine

## 2018-04-09 DIAGNOSIS — Z515 Encounter for palliative care: Secondary | ICD-10-CM

## 2018-04-25 ENCOUNTER — Encounter: Payer: Self-pay | Admitting: Internal Medicine

## 2018-04-25 NOTE — Assessment & Plan Note (Signed)
No more labs, iron was stopped per family wishes after patient extended her right CVA in April; continue supportive care

## 2018-04-25 NOTE — Progress Notes (Signed)
Location:  [facility]   Place of Service:  SNF [phone]) Provider: Margit Hanks MD   Patient Care Team: Loyal Jacobson, MD as PCP - General (Family Medicine)  Extended Emergency Contact Information Primary Emergency Contact: [facility] Phone: [phone] Mobile Phone: [phone] Relation: Daughter    Allergies: Celebrex [celecoxib]; Moxifloxacin; and Ultram [tramadol]  Chief Complaint  Patient presents with  . Acute Visit    HPI: Patient is 84 y.o. female who has been declining since she had another stroke at the end of April and it looks like she is now transitioning.  She is not eating or drinking, she is unresponsive with increased respiratory rate.  She looks like she is working to breathe.  Past Medical History:  Diagnosis Date  . Arthritis   . Edema   . GERD (gastroesophageal reflux disease)   . Glaucoma   . Macular degeneration   . Rapid heart rate     Past Surgical History:  Procedure Laterality Date  . CATARACT EXTRACTION, BILATERAL    . CESAREAN SECTION    . TONSILLECTOMY      Allergies as of 04/09/2018      Reactions   Celebrex [celecoxib]    Moxifloxacin Diarrhea, Nausea Only   Ultram [tramadol]       Medication List        Accurate as of 04/09/18 11:59 PM. Always use your most recent med list.          acetaminophen 500 MG tablet Commonly known as:  TYLENOL Take 500 mg by mouth 2 (two) times daily.   albuterol (2.5 MG/3ML) 0.083% nebulizer solution Commonly known as:  PROVENTIL Inhale 3 mLs into the lungs every 4 (four) hours as needed for wheezing or shortness of breath.   docusate sodium 100 MG capsule Commonly known as:  COLACE Take 1 capsule (100 mg total) by mouth 2 (two) times daily.   moxifloxacin 0.5 % ophthalmic solution Commonly known as:  VIGAMOX Place 1 drop into both eyes 3 (three) times daily.   NUTRITIONAL SUPPLEMENT PO Take 90 mLs by mouth 4 (four) times daily. Medpass     omeprazole 40 MG capsule Commonly known as:  PRILOSEC Take 40 mg by mouth daily.   travoprost (benzalkonium) 0.004 % ophthalmic solution Commonly known as:  TRAVATAN Place 1 drop into both eyes at bedtime.       No orders of the defined types were placed in this encounter.   Immunization History  Administered Date(s) Administered  . Influenza, High Dose Seasonal PF 09/06/2015, 09/09/2016, 08/25/2017  . Pneumococcal Conjugate-13 07/26/2014  . Pneumococcal Polysaccharide-23 04/25/2011  . Zoster 07/06/2013    Social History   Tobacco Use  . Smoking status: Never Smoker  . Smokeless tobacco: Never Used  Substance Use Topics  . Alcohol use: Not Currently    Frequency: Never    Review of Systems unable to obtain secondary to patient's pre-morbid status      Vitals:   04/25/18 2310  BP: (!) 142/80  Pulse: (!) 102  Resp: (!) 24  Temp: 98.7 F (37.1 C)   Body mass index is 18.25 kg/m. Physical Exam  GENERAL APPEARANCE: Unresponsive SKIN: No diaphoresis rash HEENT: Unremarkable RESPIRATORY: Breathing is even, labored. Lung sounds are clear   CARDIOVASCULAR: Heart regular no murmurs, rubs or gallops. No peripheral edema  GASTROINTESTINAL: Abdomen is soft, non-tender, not distended w/ normal bowel sounds.  GENITOURINARY: Bladder non tender, not distended  MUSCULOSKELETAL: Strongly thin NEUROLOGIC: Patient  is unresponsive PSYCHIATRIC: Not applicable  Patient Active Problem List   Diagnosis Date Noted  . Hypertension 03/03/2018  . Atrial fibrillation (HCC) 02/26/2018  . Hyperlipidemia LDL goal <70 02/26/2018  . Fe deficiency anemia d/t slow GI bleed 02/26/2018  . Peripheral neuropathy 02/26/2018  . Right sided cerebral infarction [facility]) R Basal ganglia s/p IV tPA 02/22/2018    CMP     Component Value Date/Time   NA 133 (L) 02/27/2018 0618   K 3.1 (L) 02/27/2018 0618   CL 98 (L) 02/27/2018 0618   CO2 26 02/27/2018 0618   GLUCOSE 91 02/27/2018 0618   BUN  12 02/27/2018 0618   CREATININE 0.67 02/27/2018 0618   CALCIUM 8.2 (L) 02/27/2018 0618   PROT 5.7 (L) 02/22/2018 1739   ALBUMIN 3.0 (L) 02/22/2018 1739   AST 19 02/22/2018 1739   ALT 8 (L) 02/22/2018 1739   ALKPHOS 76 02/22/2018 1739   BILITOT 0.7 02/22/2018 1739   GFRNONAA >60 02/27/2018 0618   GFRAA >60 02/27/2018 0618   Recent Labs    02/25/18 0520 02/26/18 0706 02/27/18 0618  NA 135 134* 133*  K 3.7 3.2* 3.1*  CL 104 99* 98*  CO2 GLUCOSE 89 89 91  BUN CREATININE 0.85 0.78 0.67  CALCIUM 8.1* 8.2* 8.2*   Recent Labs    02/22/18 1739  AST 19  ALT 8*  ALKPHOS 76  BILITOT 0.7  PROT 5.7*  ALBUMIN 3.0*   Recent Labs    02/22/18 2034  02/25/18 0520 02/25/18 2307 02/26/18 0706 02/27/18 0618  WBC 10.0   < > 7.8  --  11.4* 11.0*  NEUTROABS 5.9  --   --   --   --   --   HGB 7.0*   < > 6.7* 10.5* 10.1* 10.3*  HCT 22.3*   < > 21.5*  --  32.4* 32.9*  MCV 80.8   < > 83.0  --  85.5 86.1  PLT 288   < > 230  --  234 223   < > = values in this interval not displayed.   Recent Labs    02/23/18 0334  CHOL 138  LDLCALC 78  TRIG 33   No results found for: MICROALBUR No results found for: TSH Lab Results  Component Value Date   HGBA1C 5.2 02/23/2018   Lab Results  Component Value Date   CHOL 138 02/23/2018   HDL 53 02/23/2018   LDLCALC 78 02/23/2018   TRIG 33 02/23/2018   CHOLHDL 2.6 02/23/2018    Significant Diagnostic Results in last 30 days:  No results found.  Assessment and Plan  Dying care- patient with increased respiratory rate, and work of breathing; morphine scheduled has been written for patient comfort and patient is on O2; comfort care only      Merrilee Seashore, MD

## 2018-04-25 NOTE — Assessment & Plan Note (Signed)
And patient has had a stroke since while in the skilled nursing facility; family does not desire any intervention; patient is still eating and drinking some but not much continue supportive care

## 2018-04-25 NOTE — Assessment & Plan Note (Signed)
Blood pressure is controlled on no meds; meds were stopped April 8 when patient had an extension for stroke

## 2018-04-27 ENCOUNTER — Ambulatory Visit: Payer: Self-pay | Admitting: Neurology

## 2018-05-01 DEATH — deceased

## 2019-04-28 IMAGING — CT CT HEAD W/O CM
4 series · 16 of 47 positions shown, 18 images · non-contrast
Comparison: 02/22/2018

CLINICAL DATA: Dysarthria and left-sided weakness.

EXAM:
CT HEAD WITHOUT CONTRAST
TECHNIQUE: Contiguous axial images were obtained from the base of the skull
through the vertex without intravenous contrast.

[Series 3: head without · axial · non-contrast · 0.43mm/px · z∈[-101,+9]mm · 6 of 32 slices shown, 8 images]
[im 5/32  brain]
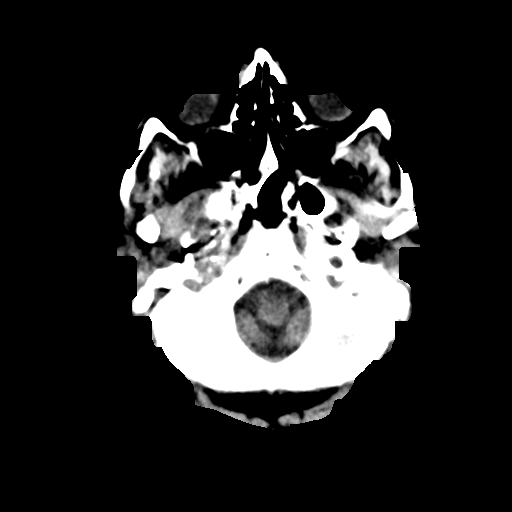
[im 5/32  bone]
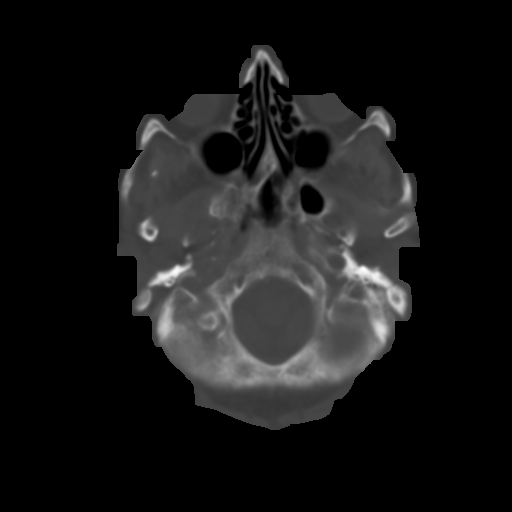
[im 9/32  brain]
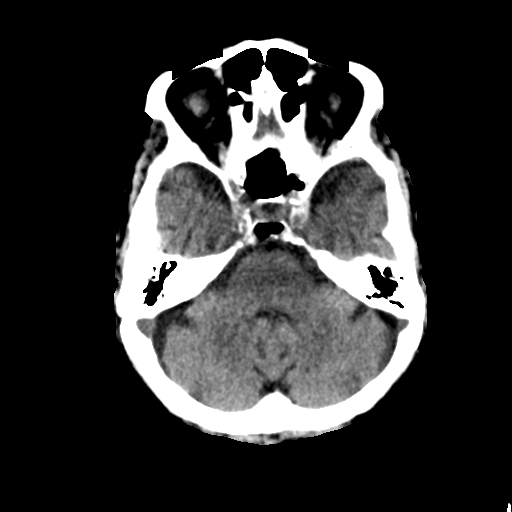
[im 14/32  brain]
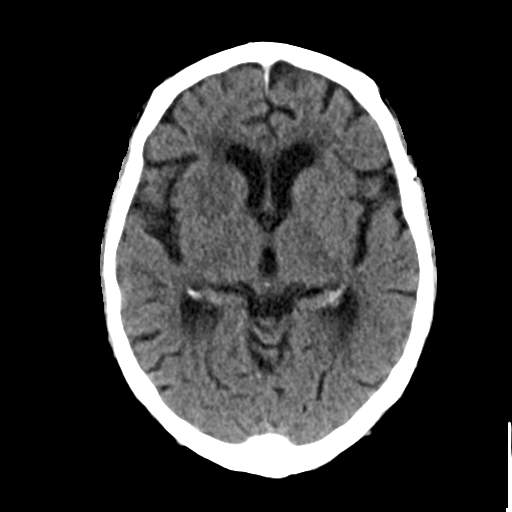
[im 18/32  brain]
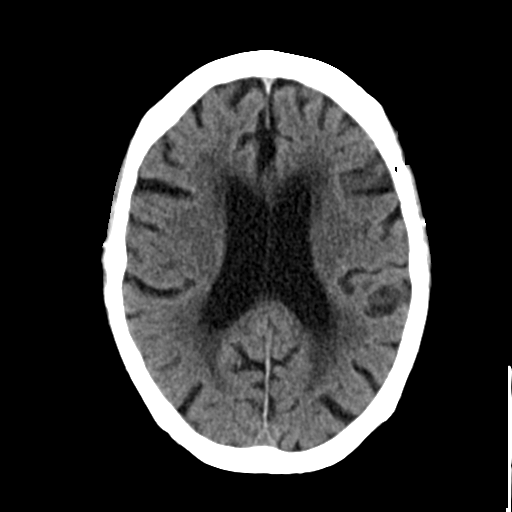
[im 23/32  brain]
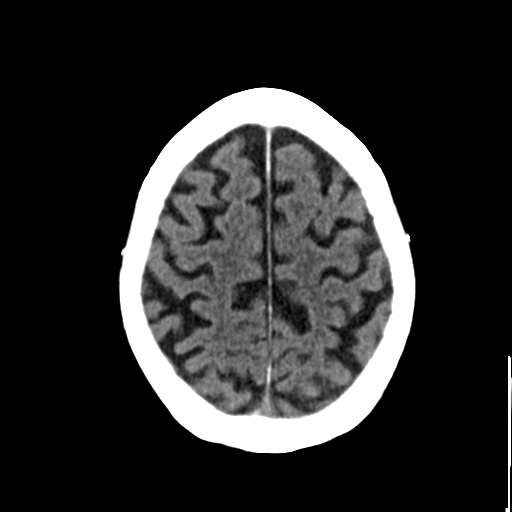
[im 23/32  bone]
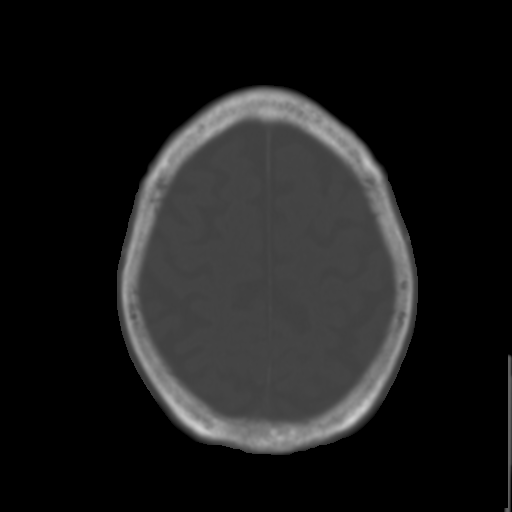
[im 27/32  brain]
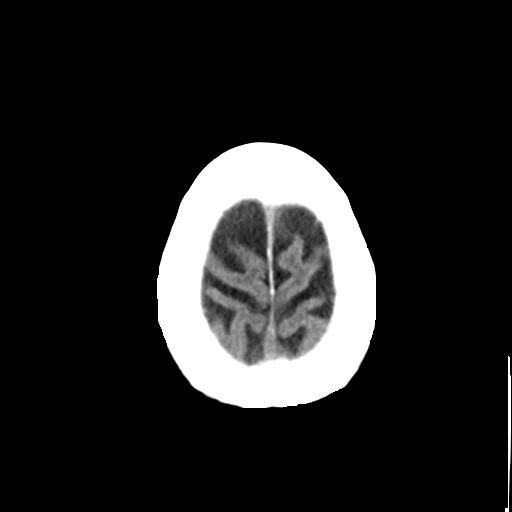

[Series 4: head bone · axial · 0.43mm/px · z∈[-107,-51]mm · 4 of 83 slices shown]
[im 8/83  bone]
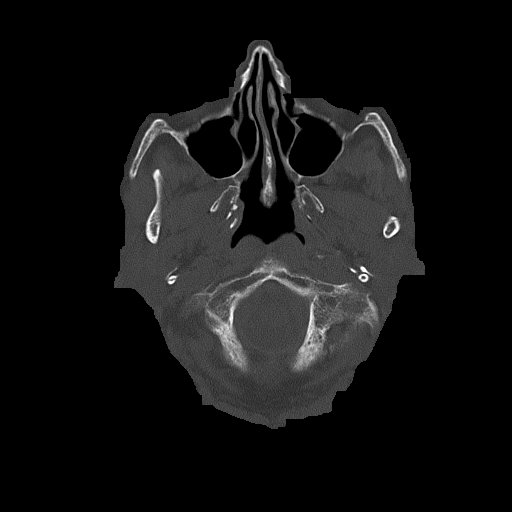
[im 16/83  bone]
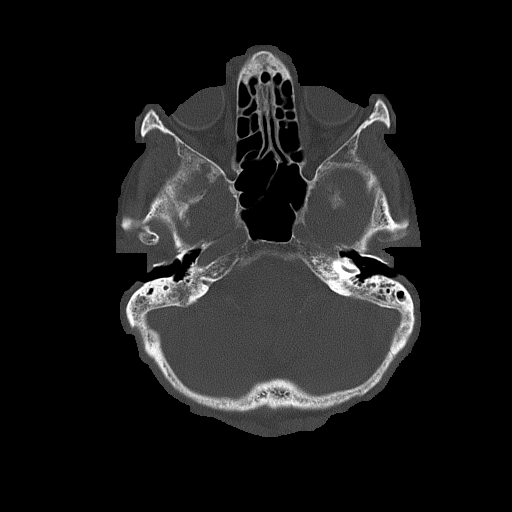
[im 28/83  bone]
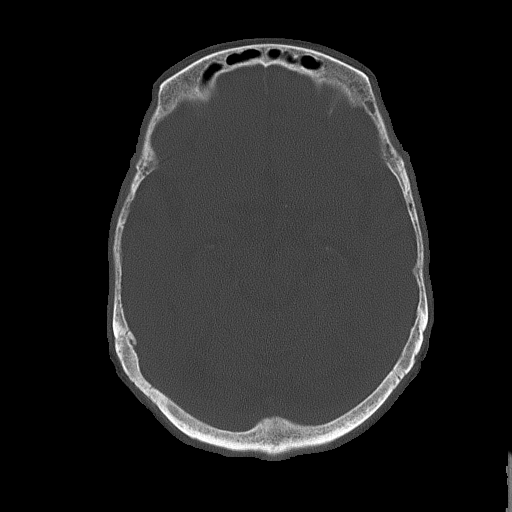
[im 36/83  bone]
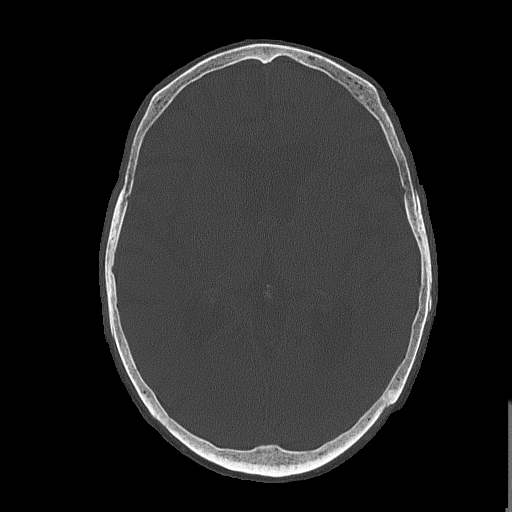

[Series 5: head without cor · coronal · non-contrast · 0.32mm/px · 3 of 67 slices shown]
[im 23/67  brain]
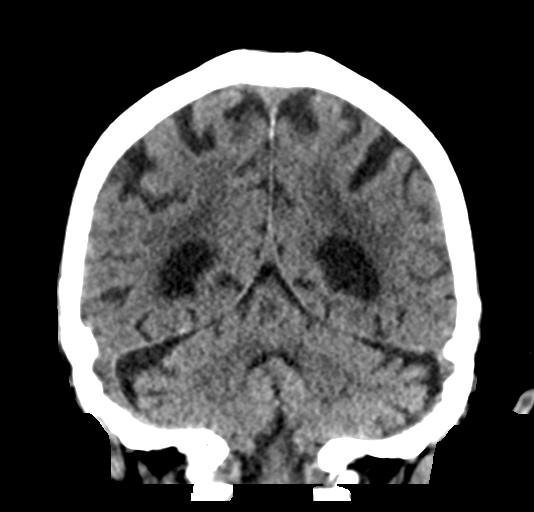
[im 30/67  brain]
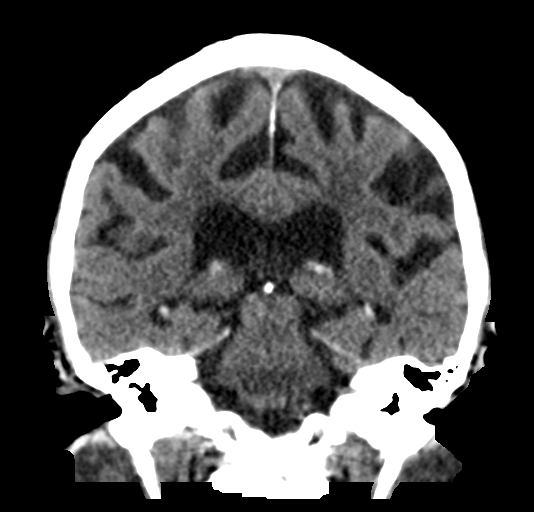
[im 37/67  brain]
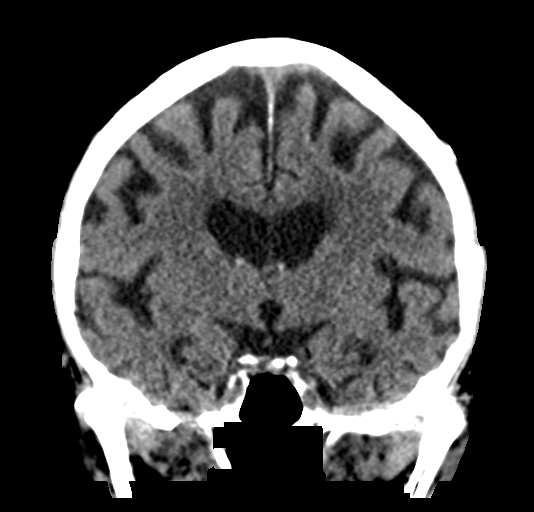

[Series 6: head without sag · sagittal · non-contrast · 0.33mm/px · 3 of 52 slices shown]
[im 18/52  brain]
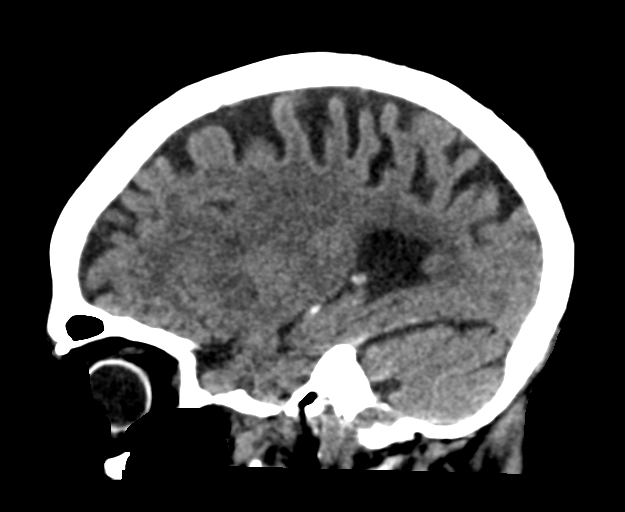
[im 26/52  brain]
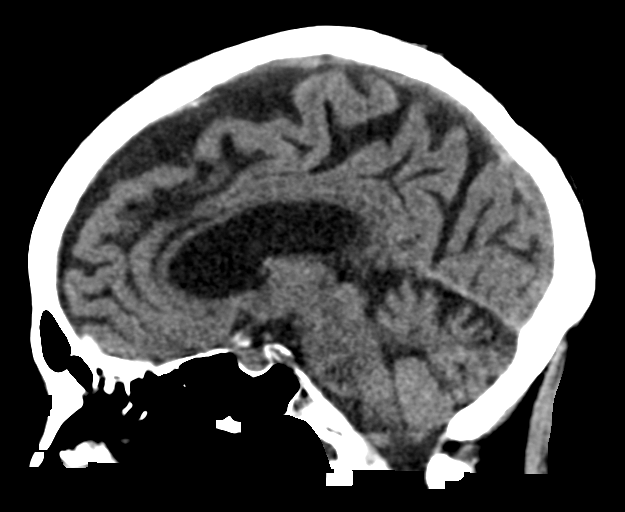
[im 35/52  brain]
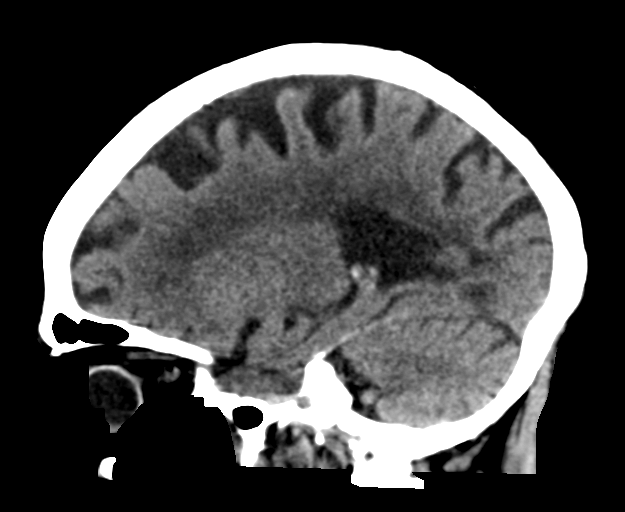

[16 of 47 positions shown; findings below may reference images not displayed]

FINDINGS: Brain: There is a developing region of low density measuring
approximately 2.5 cm in the right basal ganglia involving both the
lentiform and caudate nuclei as well as anterior limb of the
internal capsule. There is no associated hemorrhage or mass effect.
Patchy cerebral white matter hypodensities bilaterally are
nonspecific but compatible with mild-to-moderate chronic small
vessel ischemic disease. There is mild cerebral atrophy. No mass,
midline shift, or extra-axial fluid collection is present.

Vascular: Calcified atherosclerosis at the skull base. No hyperdense
vessel.

Skull: No fracture or focal osseous lesion.

Sinuses/Orbits: Visualized paranasal sinuses and mastoid air cells
are clear. Bilateral cataract extraction is noted.

Other: None.
IMPRESSION: Evolving nonhemorrhagic right basal ganglia infarct.
